# Patient Record
Sex: Male | Born: 1935 | Race: White | Hispanic: No | Smoking: Former smoker
Health system: Southern US, Community
[De-identification: ages and names within clinical notes are randomized; demographics above are authoritative.]

## PROBLEM LIST (undated history)

## (undated) ENCOUNTER — Emergency Department (HOSPITAL_COMMUNITY): Admission: EM | Discharge status: Absent without leave | Payer: Medicare Other

## (undated) DIAGNOSIS — Z8619 Personal history of other infectious and parasitic diseases: Secondary | ICD-10-CM

## (undated) DIAGNOSIS — H9190 Unspecified hearing loss, unspecified ear: Secondary | ICD-10-CM

## (undated) DIAGNOSIS — J329 Chronic sinusitis, unspecified: Secondary | ICD-10-CM

## (undated) DIAGNOSIS — I639 Cerebral infarction, unspecified: Secondary | ICD-10-CM

## (undated) DIAGNOSIS — N400 Enlarged prostate without lower urinary tract symptoms: Secondary | ICD-10-CM

## (undated) DIAGNOSIS — K449 Diaphragmatic hernia without obstruction or gangrene: Secondary | ICD-10-CM

---

## 2011-04-17 HISTORY — PX: PROSTATE SURGERY: SHX751

## 2014-04-23 ENCOUNTER — Encounter (HOSPITAL_COMMUNITY): Payer: Self-pay | Admitting: Internal Medicine

## 2014-04-23 ENCOUNTER — Inpatient Hospital Stay (HOSPITAL_COMMUNITY)
Admission: AD | Admit: 2014-04-23 | Discharge: 2014-04-26 | DRG: 480 | Disposition: A | Discharge status: Skilled Nursing Facility | Payer: Medicare Other | Source: Other Acute Inpatient Hospital | Attending: Family Medicine | Admitting: Family Medicine

## 2014-04-23 DIAGNOSIS — G9340 Encephalopathy, unspecified: Secondary | ICD-10-CM | POA: Diagnosis present

## 2014-04-23 DIAGNOSIS — Z609 Problem related to social environment, unspecified: Secondary | ICD-10-CM | POA: Diagnosis present

## 2014-04-23 DIAGNOSIS — R05 Cough: Secondary | ICD-10-CM | POA: Diagnosis not present

## 2014-04-23 DIAGNOSIS — Z823 Family history of stroke: Secondary | ICD-10-CM | POA: Diagnosis not present

## 2014-04-23 DIAGNOSIS — F22 Delusional disorders: Secondary | ICD-10-CM | POA: Diagnosis not present

## 2014-04-23 DIAGNOSIS — S42001A Fracture of unspecified part of right clavicle, initial encounter for closed fracture: Secondary | ICD-10-CM | POA: Diagnosis present

## 2014-04-23 DIAGNOSIS — S7291XA Unspecified fracture of right femur, initial encounter for closed fracture: Secondary | ICD-10-CM | POA: Diagnosis present

## 2014-04-23 DIAGNOSIS — G629 Polyneuropathy, unspecified: Secondary | ICD-10-CM | POA: Diagnosis present

## 2014-04-23 DIAGNOSIS — F411 Generalized anxiety disorder: Secondary | ICD-10-CM | POA: Diagnosis not present

## 2014-04-23 DIAGNOSIS — Z8619 Personal history of other infectious and parasitic diseases: Secondary | ICD-10-CM

## 2014-04-23 DIAGNOSIS — T40605A Adverse effect of unspecified narcotics, initial encounter: Secondary | ICD-10-CM | POA: Diagnosis not present

## 2014-04-23 DIAGNOSIS — N400 Enlarged prostate without lower urinary tract symptoms: Secondary | ICD-10-CM | POA: Diagnosis present

## 2014-04-23 DIAGNOSIS — Z66 Do not resuscitate: Secondary | ICD-10-CM | POA: Diagnosis present

## 2014-04-23 DIAGNOSIS — Z87891 Personal history of nicotine dependence: Secondary | ICD-10-CM

## 2014-04-23 DIAGNOSIS — F0391 Unspecified dementia with behavioral disturbance: Secondary | ICD-10-CM | POA: Diagnosis present

## 2014-04-23 DIAGNOSIS — F259 Schizoaffective disorder, unspecified: Secondary | ICD-10-CM | POA: Diagnosis not present

## 2014-04-23 DIAGNOSIS — H9190 Unspecified hearing loss, unspecified ear: Secondary | ICD-10-CM | POA: Diagnosis present

## 2014-04-23 DIAGNOSIS — Y92019 Unspecified place in single-family (private) house as the place of occurrence of the external cause: Secondary | ICD-10-CM | POA: Diagnosis not present

## 2014-04-23 DIAGNOSIS — K5909 Other constipation: Secondary | ICD-10-CM | POA: Diagnosis not present

## 2014-04-23 DIAGNOSIS — S72141A Displaced intertrochanteric fracture of right femur, initial encounter for closed fracture: Secondary | ICD-10-CM | POA: Diagnosis present

## 2014-04-23 DIAGNOSIS — Z8673 Personal history of transient ischemic attack (TIA), and cerebral infarction without residual deficits: Secondary | ICD-10-CM | POA: Diagnosis not present

## 2014-04-23 DIAGNOSIS — M25551 Pain in right hip: Secondary | ICD-10-CM | POA: Diagnosis present

## 2014-04-23 DIAGNOSIS — I4510 Unspecified right bundle-branch block: Secondary | ICD-10-CM | POA: Diagnosis present

## 2014-04-23 DIAGNOSIS — Z79899 Other long term (current) drug therapy: Secondary | ICD-10-CM | POA: Diagnosis not present

## 2014-04-23 DIAGNOSIS — Z7982 Long term (current) use of aspirin: Secondary | ICD-10-CM | POA: Diagnosis not present

## 2014-04-23 DIAGNOSIS — W1809XA Striking against other object with subsequent fall, initial encounter: Secondary | ICD-10-CM | POA: Diagnosis not present

## 2014-04-23 DIAGNOSIS — R1310 Dysphagia, unspecified: Secondary | ICD-10-CM | POA: Diagnosis not present

## 2014-04-23 DIAGNOSIS — S42009A Fracture of unspecified part of unspecified clavicle, initial encounter for closed fracture: Secondary | ICD-10-CM | POA: Diagnosis present

## 2014-04-23 DIAGNOSIS — F03918 Unspecified dementia, unspecified severity, with other behavioral disturbance: Secondary | ICD-10-CM | POA: Diagnosis present

## 2014-04-23 DIAGNOSIS — R9431 Abnormal electrocardiogram [ECG] [EKG]: Secondary | ICD-10-CM | POA: Diagnosis present

## 2014-04-23 DIAGNOSIS — D62 Acute posthemorrhagic anemia: Secondary | ICD-10-CM | POA: Diagnosis not present

## 2014-04-23 HISTORY — DX: Diaphragmatic hernia without obstruction or gangrene: K44.9

## 2014-04-23 HISTORY — DX: Chronic sinusitis, unspecified: J32.9

## 2014-04-23 HISTORY — DX: Benign prostatic hyperplasia without lower urinary tract symptoms: N40.0

## 2014-04-23 HISTORY — DX: Unspecified hearing loss, unspecified ear: H91.90

## 2014-04-23 HISTORY — DX: Personal history of other infectious and parasitic diseases: Z86.19

## 2014-04-23 HISTORY — DX: Cerebral infarction, unspecified: I63.9

## 2014-04-23 LAB — COMPREHENSIVE METABOLIC PANEL
ALK PHOS: 47 U/L (ref 39–117)
ALT: 21 U/L (ref 0–53)
AST: 25 U/L (ref 0–37)
Albumin: 4.2 g/dL (ref 3.5–5.2)
Anion gap: 3 — ABNORMAL LOW (ref 5–15)
BILIRUBIN TOTAL: 1.2 mg/dL (ref 0.3–1.2)
BUN: 12 mg/dL (ref 6–23)
CALCIUM: 8.4 mg/dL (ref 8.4–10.5)
CHLORIDE: 103 meq/L (ref 96–112)
CO2: 27 mmol/L (ref 19–32)
Creatinine, Ser: 0.88 mg/dL (ref 0.50–1.35)
GFR, EST AFRICAN AMERICAN: 88 mL/min — AB (ref >90)
GFR, EST NON AFRICAN AMERICAN: 76 mL/min — AB (ref >90)
GLUCOSE: 106 mg/dL — AB (ref 70–99)
Potassium: 3.3 mmol/L — ABNORMAL LOW (ref 3.5–5.1)
SODIUM: 133 mmol/L — AB (ref 135–145)
Total Protein: 6.6 g/dL (ref 6.0–8.3)

## 2014-04-23 LAB — CBC
HEMATOCRIT: 37.4 % — AB (ref 39.0–52.0)
Hemoglobin: 12.2 g/dL — ABNORMAL LOW (ref 13.0–17.0)
MCH: 30.2 pg (ref 26.0–34.0)
MCHC: 32.6 g/dL (ref 30.0–36.0)
MCV: 92.6 fL (ref 78.0–100.0)
Platelets: 183 10*3/uL (ref 150–400)
RBC: 4.04 MIL/uL — ABNORMAL LOW (ref 4.22–5.81)
RDW: 13.6 % (ref 11.5–15.5)
WBC: 7.5 10*3/uL (ref 4.0–10.5)

## 2014-04-23 LAB — TYPE AND SCREEN
ABO/RH(D): O NEG
ANTIBODY SCREEN: NEGATIVE

## 2014-04-23 LAB — PHOSPHORUS: Phosphorus: 3.1 mg/dL (ref 2.3–4.6)

## 2014-04-23 LAB — MAGNESIUM: Magnesium: 2 mg/dL (ref 1.5–2.5)

## 2014-04-23 LAB — CK: CK TOTAL: 1022 U/L — AB (ref 7–232)

## 2014-04-23 LAB — TROPONIN I: Troponin I: 0.03 ng/mL (ref <0.031)

## 2014-04-23 MED ORDER — LORAZEPAM 2 MG/ML IJ SOLN: 0.5000 mg | Freq: Four times a day (QID) | INTRAMUSCULAR | Status: DC | PRN

## 2014-04-23 MED ORDER — LORAZEPAM 0.5 MG PO TABS
0.5000 mg | ORAL_TABLET | Freq: Every day | ORAL | Status: DC
  Administered 2014-04-23 – 2014-04-25 (×3): 0.5 mg via ORAL
  Filled 2014-04-23 (×3): qty 1

## 2014-04-23 MED ORDER — SODIUM CHLORIDE 0.9 % IV SOLN
INTRAVENOUS | Status: AC
  Administered 2014-04-23: via INTRAVENOUS

## 2014-04-23 MED ORDER — TAMSULOSIN HCL 0.4 MG PO CAPS
0.4000 mg | ORAL_CAPSULE | Freq: Every day | ORAL | Status: DC
  Administered 2014-04-23 – 2014-04-25 (×3): 0.4 mg via ORAL
  Filled 2014-04-23 (×3): qty 1

## 2014-04-23 MED ORDER — MORPHINE SULFATE 2 MG/ML IJ SOLN
0.5000 mg | INTRAMUSCULAR | Status: DC | PRN
Start: 2014-04-23 — End: 2014-04-24

## 2014-04-23 MED ORDER — METHOCARBAMOL 1000 MG/10ML IJ SOLN
500.0000 mg | Freq: Four times a day (QID) | INTRAVENOUS | Status: DC | PRN
  Filled 2014-04-23: qty 5

## 2014-04-23 MED ORDER — CEFAZOLIN SODIUM-DEXTROSE 2-3 GM-% IV SOLR
2.0000 g | Freq: Once | INTRAVENOUS | Status: AC
  Administered 2014-04-24: 2 g via INTRAVENOUS

## 2014-04-23 MED ORDER — METHOCARBAMOL 500 MG PO TABS
500.0000 mg | ORAL_TABLET | Freq: Four times a day (QID) | ORAL | Status: DC | PRN
  Administered 2014-04-23: 500 mg via ORAL
  Filled 2014-04-23: qty 1

## 2014-04-23 MED ORDER — METOPROLOL TARTRATE 25 MG PO TABS
12.5000 mg | ORAL_TABLET | Freq: Two times a day (BID) | ORAL | Status: DC
  Administered 2014-04-23 – 2014-04-24 (×2): 12.5 mg via ORAL
  Filled 2014-04-23 (×3): qty 1

## 2014-04-23 MED ORDER — GABAPENTIN 300 MG PO CAPS
300.0000 mg | ORAL_CAPSULE | Freq: Two times a day (BID) | ORAL | Status: DC
  Administered 2014-04-23 – 2014-04-26 (×6): 300 mg via ORAL
  Filled 2014-04-23 (×8): qty 1

## 2014-04-23 MED ORDER — HYDROCODONE-ACETAMINOPHEN 5-325 MG PO TABS
1.0000 | ORAL_TABLET | Freq: Four times a day (QID) | ORAL | Status: DC | PRN
  Administered 2014-04-23: 2 via ORAL
  Filled 2014-04-23: qty 2

## 2014-04-23 MED ORDER — DOCUSATE SODIUM 100 MG PO CAPS
100.0000 mg | ORAL_CAPSULE | Freq: Two times a day (BID) | ORAL | Status: DC
  Administered 2014-04-23 – 2014-04-26 (×6): 100 mg via ORAL
  Filled 2014-04-23 (×7): qty 1

## 2014-04-23 MED ORDER — INFLUENZA VAC SPLIT QUAD 0.5 ML IM SUSY
0.5000 mL | PREFILLED_SYRINGE | INTRAMUSCULAR | Status: AC
  Administered 2014-04-26: 0.5 mL via INTRAMUSCULAR
  Filled 2014-04-23 (×3): qty 0.5

## 2014-04-23 MED ORDER — CHLORHEXIDINE GLUCONATE 4 % EX LIQD
60.0000 mL | Freq: Once | CUTANEOUS | Status: AC
  Administered 2014-04-24: 4 via TOPICAL
  Filled 2014-04-23: qty 60

## 2014-04-23 NOTE — Anesthesia Preprocedure Evaluation (Addendum)
Anesthesia Evaluation  Patient identified by MRN, date of birth, ID band Patient awake    Reviewed: Allergy & Precautions, NPO status , Patient's Chart, lab work & pertinent test results  History of Anesthesia Complications Negative for: history of anesthetic complications  Airway Mallampati: II  TM Distance: >3 FB Neck ROM: Full    Dental  (+) Edentulous Lower, Poor Dentition   Pulmonary former smoker,    Pulmonary exam normal       Cardiovascular negative cardio ROS  Rhythm:Regular     Neuro/Psych Dementia CVA    GI/Hepatic Neg liver ROS, hiatal hernia,   Endo/Other  negative endocrine ROS  Renal/GU negative Renal ROS     Musculoskeletal   Abdominal   Peds  Hematology   Anesthesia Other Findings   Reproductive/Obstetrics                           Anesthesia Physical Anesthesia Plan  ASA: III  Anesthesia Plan: MAC and Spinal   Post-op Pain Management:    Induction:   Airway Management Planned: Simple Face Mask  Additional Equipment:   Intra-op Plan:   Post-operative Plan:   Informed Consent: I have reviewed the patients History and Physical, chart, labs and discussed the procedure including the risks, benefits and alternatives for the proposed anesthesia with the patient or authorized representative who has indicated his/her understanding and acceptance.   Consent reviewed with POA  Plan Discussed with: CRNA, Anesthesiologist and Surgeon  Anesthesia Plan Comments:       Anesthesia Quick Evaluation

## 2014-04-23 NOTE — H&P (Addendum)
PCP: HASANAJ,XAJE A, MD    Chief Complaint:  fall  HPI: Scott Lin. is a 81 y.o. male   has a past medical history of Stroke; Sinusitis; History of shingles; BPH (benign prostatic hyperplasia); Hiatal hernia; and Hard of hearing.   Presented with  Sometime during the night patient tripped on his shoes and fell he lives alone. Patient was found within few hours.  He had pain on right side.  Taken to [facility] where plain films showed Right clavicular fracture and right proximal femur fracture. CT head showed no ICH but old CVA. He was transferred to [facility] for orthopedic intervention. Dr. Jillyn Hidden is aware of the patient and plans to operate in AM. Patient has no known cardiac hx. No complaints of chest pain. He never had any cardiac testing. Patient was able to walk with out chst pain or shortness of breath for about a tenth of a mile.  Family reports he has chronic cough. CXR at [facility] was unremarkable.  On arrival patient had ECG done that showed transient wide complex rhythm patient was asymptomatic at that time. Per [facility] records K wnl. Patient per family patient has been confused and have had paranoid ideas about his family. Family states he is not allowed to have phone in his house as he will constantly call  911.   Hospitalist was called for admission for [facility] fracture  Review of Systems:    Pertinent positives include: anxiety, non-productive cough,  Constitutional:  No weight loss, night sweats, Fevers, chills, fatigue, weight loss  HEENT:  No headaches, Difficulty swallowing,Tooth/dental problems,Sore throat,  No sneezing, itching, ear ache, nasal congestion, post nasal drip,  Cardio-vascular:  No chest pain, Orthopnea, PND, anasarca, dizziness, palpitations.no Bilateral lower extremity swelling  GI:  No heartburn, indigestion, abdominal pain, nausea, vomiting, diarrhea, change in bowel habits, loss of appetite, melena, blood in stool, hematemesis Resp:  no shortness  of breath at rest. No dyspnea on exertion, No excess mucus, no productive cough, No  No coughing up of blood.No change in color of mucus.No wheezing. Skin:  no rash or lesions. No jaundice GU:  no dysuria, change in color of urine, no urgency or frequency. No straining to urinate.  No flank pain.  Musculoskeletal:  No joint pain or no joint swelling. No decreased range of motion. No back pain.  Psych:  No change in mood or affect. No depression or anxiety. No memory loss.  Neuro: no localizing neurological complaints, no tingling, no weakness, no double vision, no gait abnormality, no slurred speech, no confusion  Otherwise ROS are negative except for above, 10 systems were reviewed  Past Medical History: Past Medical History  Diagnosis Date  . Stroke   . Sinusitis   . History of shingles   . BPH (benign prostatic hyperplasia)   . Hiatal hernia   . Hard of hearing    No past surgical history on file.   Medications: Prior to Admission medications   Medication Sig Start Date End Date Taking? Authorizing Provider  aspirin 81 MG tablet Take 81 mg by mouth daily.   Yes Historical Provider, MD    Allergies:  No Known Allergies  Social History:  Ambulatory   independently  Lives at home alone,           Family History: family history is not on file.    Physical Exam: Patient Vitals for the past 24 hrs:  BP Temp Pulse Resp SpO2  04/23/14 1825 (!) 129/46 mmHg 98.1  F (36.7 C) 92 18 98 %    1. General:  in No Acute distress, hard of hearing 2. Psychological: Alert but not Oriented 3. Head/ENT:   Dry Mucous Membranes                          Head Non traumatic, neck supple                          Poor Dentition 4. SKIN: decreased Skin turgor,  Skin clean Dry and intact no rash 5. Heart: Regular rate and rhythm mild Murmur noted, Rub or gallop 6. Lungs: Clear to auscultation bilaterally, no wheezes or crackles   7. Abdomen: Soft, non-tender, Non distended 8. Lower  extremities: no clubbing, cyanosis, or edema, Right Leg externally rotated 9. Neurologically Grossly intact, moving all 4 extremities equally 10. MSK: diminished range of motion of right leg  body mass index is unknown because there is no height or weight on file.   Labs on Admission:   No results found for this or any previous visit (from the past 24 hour(s)).  UA will order  No results found for: HGBA1C  CrCl cannot be calculated (Unknown ideal weight.).  BNP (last 3 results) No results for input(s): PROBNP in the last 8760 hours.  Other results:  I have pearsonaly reviewed this: ECG REPORT  Rate:80 Rhythm:  SR Left anterior fascicular block RBBB  ST&T Change: none Repeat ECG shoed wide QRS rhythm at rate 114   There were no vitals filed for this visit.   Cultures: No results found for: SDES, SPECREQUEST, CULT, REPTSTATUS   Radiological Exams on Admission: No results found.  Chart has been reviewed  Assessment/Plan  81 year old gentleman with history of dementia with paranoid ideas presents after mechanical fall with right femur fracture and right clavicle fracture. Routine EKG noted to have transient wide QRS complex arrhythmia While patient is asymptomatic  Present on Admission:   . Femur fracture, right - as per orthopedics, given abnormal ECG patient is moderate risk from cardiac standpoint. Will monitor on telemetry. Given worsening confusion cycle cardiac enzymes patient will benefit from echo gram. Patient's family at this point leaning he can stay any aggressive interventions except for fixing acute fractures. Family states they do understand that although patient may have increased risk from orthopedic procedure the benefits would outweigh the risks. We will start on perioperative beta blocker. Obtain electrolytes . Clavicle fracture as per orthopedics . Dementia with behavioral disturbance - patient has been showing worsening dementia with paranoid  delusions. Per family his currently more confused than his baseline. He would benefit from psychiatric evaluation, spoke with family and that it is not safe for patient to remain in current social situation due to severity of his confusion and inability to call for help  Encephalopathy. Patient appears to be confused . CT scan of the outside facility was unremarkable for head bleed. Will obtain UA. Give gentle fluids. Sundowning is a possibility  . BPH (benign prostatic hyperplasia) - continue Flomax  . Generalized anxiety disorder - patient takes Ativan at home. Family is unsure what the doses. Will start on Ativan 0.5 by mouth daily at bedtime the family will obtain the proper dosage for this in the morning  . Nonspecific abnormal electrocardiogram (ECG) (EKG) - given abnormal EKG will cycle cardiac enzymes patient would benefit from echo gram to evaluate EF or any wall motion abnormality  Prophylaxis: SCD  CODE STATUS:  DNR/DNI as per family  Other plan as per orders.  I have spent a total of 55 min on this admission  Scott Lin 04/23/2014, 8:03 PM  [facility]  Pager [phone]   after 2 AM please page floor coverage PA If 7AM-7PM, please contact the day team taking care of the patient  [email or web address]  Password [email or web address]

## 2014-04-23 NOTE — Consult Note (Signed)
Reason for Consult:Right hip  Fracture   Referring Physician: EDP   Scott Rainbowhomas Willig Jr. is an 81 y.o. male.  HPI: Larey SeatFell today. Demented. Seen in ER.   Past Medical History  Diagnosis Date  . Stroke   . Sinusitis   . History of shingles   . BPH (benign prostatic hyperplasia)   . Hiatal hernia   . Hard of hearing     Past Surgical History  Procedure Laterality Date  . Prostate surgery  2013    Family History  Problem Relation Age of Onset  . Stroke Father   . Dementia Sister     Social History:  reports that he has quit smoking. His smoking use included Cigars. He does not have any smokeless tobacco history on file. He reports that he does not drink alcohol or use illicit drugs.  Allergies: No Known Allergies  Medications:Meds reviewed    No results found for this or any previous visit (from the past 48 hour(s)).  No results found.  Review of Systems  Musculoskeletal: Positive for joint pain.  Psychiatric/Behavioral: The patient is nervous/anxious.   All other systems reviewed and are negative.  Blood pressure 129/46, pulse 92, temperature 98.1 F (36.7 C), temperature source Oral, resp. rate 18, height 6' (1.829 m), weight 64.864 kg (143 lb), SpO2 98 %. Physical Exam  Constitutional: He is oriented to person, place, and time. He appears well-developed.  HENT:  Head: Normocephalic.  Eyes: Pupils are equal, round, and reactive to light.  Neck: Normal range of motion.  Cardiovascular: Normal rate and regular rhythm.   Respiratory: Effort normal and breath sounds normal.  GI: Soft. Bowel sounds are normal.  Musculoskeletal: He exhibits tenderness.  Right hip short and rotated  Pain with motion of hip . NVI No DVT. Pain with palpation of clavicle  Neurological: He is alert and oriented to person, place, and time.  Skin: Skin is warm and dry.  Psychiatric: He has a normal mood and affect.    Assessment/Plan: Right hip intertrochanteric hip fracture. Clavicle  fracture. Plan IM nail hip when cleared. Risks discussed with family. Patient confused.   Adjoa Althouse C 04/23/2014, 9:49 PM  [phone]

## 2014-04-24 ENCOUNTER — Inpatient Hospital Stay (HOSPITAL_COMMUNITY): Payer: Medicare Other | Admitting: Anesthesiology

## 2014-04-24 ENCOUNTER — Encounter (HOSPITAL_COMMUNITY)
Admission: AD | Disposition: A | Discharge status: Skilled Nursing Facility | Payer: Self-pay | Source: Other Acute Inpatient Hospital | Attending: Family Medicine

## 2014-04-24 ENCOUNTER — Inpatient Hospital Stay (HOSPITAL_COMMUNITY): Payer: Medicare Other

## 2014-04-24 DIAGNOSIS — F259 Schizoaffective disorder, unspecified: Secondary | ICD-10-CM

## 2014-04-24 HISTORY — PX: INTRAMEDULLARY (IM) NAIL INTERTROCHANTERIC: SHX5875

## 2014-04-24 LAB — BASIC METABOLIC PANEL
Anion gap: 9 (ref 5–15)
BUN: 14 mg/dL (ref 6–23)
CALCIUM: 8.7 mg/dL (ref 8.4–10.5)
CO2: 27 mmol/L (ref 19–32)
Chloride: 101 mEq/L (ref 96–112)
Creatinine, Ser: 0.99 mg/dL (ref 0.50–1.35)
GFR calc Af Amer: 84 mL/min — ABNORMAL LOW (ref >90)
GFR, EST NON AFRICAN AMERICAN: 73 mL/min — AB (ref >90)
Glucose, Bld: 106 mg/dL — ABNORMAL HIGH (ref 70–99)
Potassium: 3.7 mmol/L (ref 3.5–5.1)
Sodium: 137 mmol/L (ref 135–145)

## 2014-04-24 LAB — CBC
HCT: 36.5 % — ABNORMAL LOW (ref 39.0–52.0)
Hemoglobin: 11.7 g/dL — ABNORMAL LOW (ref 13.0–17.0)
MCH: 30.1 pg (ref 26.0–34.0)
MCHC: 32.1 g/dL (ref 30.0–36.0)
MCV: 93.8 fL (ref 78.0–100.0)
Platelets: 188 10*3/uL (ref 150–400)
RBC: 3.89 MIL/uL — ABNORMAL LOW (ref 4.22–5.81)
RDW: 13.7 % (ref 11.5–15.5)
WBC: 8.1 10*3/uL (ref 4.0–10.5)

## 2014-04-24 LAB — TROPONIN I
TROPONIN I: 0.03 ng/mL (ref <0.031)
Troponin I: 0.03 ng/mL (ref <0.031)

## 2014-04-24 LAB — TSH: TSH: 1.912 u[IU]/mL (ref 0.350–4.500)

## 2014-04-24 LAB — ABO/RH: ABO/RH(D): O NEG

## 2014-04-24 LAB — VITAMIN B12: Vitamin B-12: 815 pg/mL (ref 211–911)

## 2014-04-24 SURGERY — FIXATION, FRACTURE, INTERTROCHANTERIC, WITH INTRAMEDULLARY ROD
Anesthesia: Monitor Anesthesia Care | Site: Hip | Laterality: Right

## 2014-04-24 MED ORDER — PHENOL 1.4 % MT LIQD: 1.0000 | OROMUCOSAL | Status: DC | PRN

## 2014-04-24 MED ORDER — CETYLPYRIDINIUM CHLORIDE 0.05 % MT LIQD
7.0000 mL | Freq: Two times a day (BID) | OROMUCOSAL | Status: DC
  Administered 2014-04-25 (×2): 7 mL via OROMUCOSAL

## 2014-04-24 MED ORDER — CHLORHEXIDINE GLUCONATE 0.12 % MT SOLN: 15.0000 mL | Freq: Two times a day (BID) | OROMUCOSAL | Status: DC

## 2014-04-24 MED ORDER — RESOURCE THICKENUP CLEAR PO POWD
ORAL | Status: DC | PRN
  Filled 2014-04-24: qty 125

## 2014-04-24 MED ORDER — PHENYLEPHRINE HCL 10 MG/ML IJ SOLN
10.0000 mg | INTRAVENOUS | Status: DC | PRN
  Administered 2014-04-24: 15 ug/min via INTRAVENOUS

## 2014-04-24 MED ORDER — ASPIRIN EC 325 MG PO TBEC
325.0000 mg | DELAYED_RELEASE_TABLET | Freq: Every day | ORAL | Status: DC
  Administered 2014-04-25 – 2014-04-26 (×2): 325 mg via ORAL
  Filled 2014-04-24 (×3): qty 1

## 2014-04-24 MED ORDER — HYDROMORPHONE HCL 1 MG/ML IJ SOLN
INTRAMUSCULAR | Status: AC
  Filled 2014-04-24: qty 1

## 2014-04-24 MED ORDER — MENTHOL 3 MG MT LOZG: 1.0000 | LOZENGE | OROMUCOSAL | Status: DC | PRN

## 2014-04-24 MED ORDER — ACETAMINOPHEN 650 MG RE SUPP: 650.0000 mg | Freq: Four times a day (QID) | RECTAL | Status: DC | PRN

## 2014-04-24 MED ORDER — LORAZEPAM 0.5 MG PO TABS
0.5000 mg | ORAL_TABLET | Freq: Every day | ORAL | Status: DC
  Administered 2014-04-25 – 2014-04-26 (×2): 0.5 mg via ORAL
  Filled 2014-04-24 (×2): qty 1

## 2014-04-24 MED ORDER — POTASSIUM CHLORIDE IN NACL 20-0.9 MEQ/L-% IV SOLN
INTRAVENOUS | Status: AC
  Filled 2014-04-24: qty 1000

## 2014-04-24 MED ORDER — CEFAZOLIN SODIUM-DEXTROSE 2-3 GM-% IV SOLR
INTRAVENOUS | Status: AC
  Filled 2014-04-24: qty 50

## 2014-04-24 MED ORDER — HYDROCODONE-ACETAMINOPHEN 5-325 MG PO TABS: 1.0000 | ORAL_TABLET | Freq: Four times a day (QID) | ORAL | Status: DC | PRN

## 2014-04-24 MED ORDER — ONDANSETRON HCL 4 MG/2ML IJ SOLN
INTRAMUSCULAR | Status: AC
  Filled 2014-04-24: qty 2

## 2014-04-24 MED ORDER — LACTATED RINGERS IV SOLN
INTRAVENOUS | Status: DC | PRN
  Administered 2014-04-24: 08:00:00 via INTRAVENOUS

## 2014-04-24 MED ORDER — MORPHINE SULFATE 2 MG/ML IJ SOLN: 0.5000 mg | INTRAMUSCULAR | Status: DC | PRN

## 2014-04-24 MED ORDER — 0.9 % SODIUM CHLORIDE (POUR BTL) OPTIME
TOPICAL | Status: DC | PRN
  Administered 2014-04-24: 1000 mL

## 2014-04-24 MED ORDER — FENTANYL CITRATE 0.05 MG/ML IJ SOLN
INTRAMUSCULAR | Status: DC | PRN
  Administered 2014-04-24 (×2): 25 ug via INTRAVENOUS

## 2014-04-24 MED ORDER — PROMETHAZINE HCL 25 MG/ML IJ SOLN: 6.2500 mg | INTRAMUSCULAR | Status: DC | PRN

## 2014-04-24 MED ORDER — PROPOFOL 10 MG/ML IV BOLUS
INTRAVENOUS | Status: AC
  Filled 2014-04-24: qty 20

## 2014-04-24 MED ORDER — FENTANYL CITRATE 0.05 MG/ML IJ SOLN
INTRAMUSCULAR | Status: AC
  Filled 2014-04-24: qty 2

## 2014-04-24 MED ORDER — ASPIRIN EC 325 MG PO TBEC: 325.0000 mg | DELAYED_RELEASE_TABLET | Freq: Every day | ORAL | Status: AC

## 2014-04-24 MED ORDER — METOCLOPRAMIDE HCL 10 MG PO TABS: 5.0000 mg | ORAL_TABLET | Freq: Three times a day (TID) | ORAL | Status: DC | PRN

## 2014-04-24 MED ORDER — PHENYLEPHRINE HCL 10 MG/ML IJ SOLN
INTRAMUSCULAR | Status: DC | PRN
  Administered 2014-04-24: 80 ug via INTRAVENOUS
  Administered 2014-04-24: 120 ug via INTRAVENOUS
  Administered 2014-04-24 (×2): 80 ug via INTRAVENOUS

## 2014-04-24 MED ORDER — BUPIVACAINE IN DEXTROSE 0.75-8.25 % IT SOLN
INTRATHECAL | Status: DC | PRN
  Administered 2014-04-24: 12 mg via INTRATHECAL

## 2014-04-24 MED ORDER — PROPOFOL INFUSION 10 MG/ML OPTIME
INTRAVENOUS | Status: DC | PRN
  Administered 2014-04-24: 100 ug/kg/min via INTRAVENOUS

## 2014-04-24 MED ORDER — CISATRACURIUM BESYLATE 20 MG/10ML IV SOLN
INTRAVENOUS | Status: AC
  Filled 2014-04-24: qty 10

## 2014-04-24 MED ORDER — ONDANSETRON HCL 4 MG PO TABS: 4.0000 mg | ORAL_TABLET | Freq: Four times a day (QID) | ORAL | Status: DC | PRN

## 2014-04-24 MED ORDER — METOCLOPRAMIDE HCL 5 MG/ML IJ SOLN: 5.0000 mg | Freq: Three times a day (TID) | INTRAMUSCULAR | Status: DC | PRN

## 2014-04-24 MED ORDER — VITAMIN D3 25 MCG (1000 UNIT) PO TABS
1000.0000 [IU] | ORAL_TABLET | Freq: Every day | ORAL | Status: DC
  Administered 2014-04-24 – 2014-04-26 (×3): 1000 [IU] via ORAL
  Filled 2014-04-24 (×3): qty 1

## 2014-04-24 MED ORDER — HYDROMORPHONE HCL 1 MG/ML IJ SOLN
0.2500 mg | INTRAMUSCULAR | Status: DC | PRN
  Administered 2014-04-24 (×2): 0.5 mg via INTRAVENOUS

## 2014-04-24 MED ORDER — CHLORHEXIDINE GLUCONATE 0.12 % MT SOLN
15.0000 mL | Freq: Two times a day (BID) | OROMUCOSAL | Status: DC
  Administered 2014-04-24 – 2014-04-25 (×2): 15 mL via OROMUCOSAL
  Filled 2014-04-24 (×7): qty 15

## 2014-04-24 MED ORDER — OXYCODONE HCL 5 MG PO TABS: 5.0000 mg | ORAL_TABLET | Freq: Once | ORAL | Status: DC | PRN

## 2014-04-24 MED ORDER — DOCUSATE SODIUM 100 MG PO CAPS: 100.0000 mg | ORAL_CAPSULE | Freq: Two times a day (BID) | ORAL | Status: AC | PRN

## 2014-04-24 MED ORDER — HYDROCODONE-ACETAMINOPHEN 5-325 MG PO TABS
1.0000 | ORAL_TABLET | Freq: Four times a day (QID) | ORAL | Status: DC | PRN
  Administered 2014-04-25: 2 via ORAL
  Filled 2014-04-24: qty 2

## 2014-04-24 MED ORDER — OXYCODONE HCL 5 MG/5ML PO SOLN: 5.0000 mg | Freq: Once | ORAL | Status: DC | PRN

## 2014-04-24 MED ORDER — CEFAZOLIN SODIUM-DEXTROSE 2-3 GM-% IV SOLR
2.0000 g | Freq: Three times a day (TID) | INTRAVENOUS | Status: AC
  Administered 2014-04-24 – 2014-04-25 (×2): 2 g via INTRAVENOUS
  Filled 2014-04-24 (×2): qty 50

## 2014-04-24 MED ORDER — ACETAMINOPHEN 325 MG PO TABS: 650.0000 mg | ORAL_TABLET | Freq: Four times a day (QID) | ORAL | Status: DC | PRN

## 2014-04-24 MED ORDER — PROPOFOL INFUSION 10 MG/ML OPTIME: INTRAVENOUS | Status: DC | PRN

## 2014-04-24 MED ORDER — ONDANSETRON HCL 4 MG/2ML IJ SOLN: 4.0000 mg | Freq: Four times a day (QID) | INTRAMUSCULAR | Status: DC | PRN

## 2014-04-24 MED ORDER — POTASSIUM CHLORIDE IN NACL 20-0.9 MEQ/L-% IV SOLN
INTRAVENOUS | Status: AC
  Administered 2014-04-24: 10:00:00 via INTRAVENOUS
  Filled 2014-04-24 (×3): qty 1000

## 2014-04-24 MED ORDER — PROPOFOL 10 MG/ML IV BOLUS
INTRAVENOUS | Status: DC | PRN
  Administered 2014-04-24 (×2): 20 mg via INTRAVENOUS

## 2014-04-24 SURGICAL SUPPLY — 43 items
BAG ZIPLOCK 12X15 (MISCELLANEOUS) ×3 IMPLANT
BIT DRILL CANN LG 4.3MM (BIT) ×1 IMPLANT
BNDG COHESIVE 6X5 TAN STRL LF (GAUZE/BANDAGES/DRESSINGS) ×3 IMPLANT
BNDG GAUZE ELAST 4 BULKY (GAUZE/BANDAGES/DRESSINGS) ×3 IMPLANT
CLOTH 2% CHLOROHEXIDINE 3PK (PERSONAL CARE ITEMS) ×3 IMPLANT
COVER PERINEAL POST (MISCELLANEOUS) ×3 IMPLANT
DRAPE C-ARM 42X120 X-RAY (DRAPES) IMPLANT
DRAPE STERI IOBAN 125X83 (DRAPES) ×3 IMPLANT
DRILL BIT CANN LG 4.3MM (BIT) ×3
DRSG MEPILEX BORDER 4X4 (GAUZE/BANDAGES/DRESSINGS) ×3 IMPLANT
DRSG MEPILEX BORDER 4X8 (GAUZE/BANDAGES/DRESSINGS) ×3 IMPLANT
DRSG PAD ABDOMINAL 8X10 ST (GAUZE/BANDAGES/DRESSINGS) ×3 IMPLANT
DURAPREP 26ML APPLICATOR (WOUND CARE) ×3 IMPLANT
ELECT REM PT RETURN 9FT ADLT (ELECTROSURGICAL) ×3
ELECTRODE REM PT RTRN 9FT ADLT (ELECTROSURGICAL) ×1 IMPLANT
GAUZE SPONGE 4X4 12PLY STRL (GAUZE/BANDAGES/DRESSINGS) ×3 IMPLANT
GLOVE BIOGEL PI IND STRL 7.5 (GLOVE) ×1 IMPLANT
GLOVE BIOGEL PI IND STRL 8 (GLOVE) ×1 IMPLANT
GLOVE BIOGEL PI INDICATOR 7.5 (GLOVE) ×2
GLOVE BIOGEL PI INDICATOR 8 (GLOVE) ×2
GLOVE SURG SS PI 7.5 STRL IVOR (GLOVE) ×3 IMPLANT
GLOVE SURG SS PI 8.0 STRL IVOR (GLOVE) IMPLANT
GOWN STRL REUS W/ TWL XL LVL3 (GOWN DISPOSABLE) ×1 IMPLANT
GOWN STRL REUS W/TWL LRG LVL3 (GOWN DISPOSABLE) ×3 IMPLANT
GOWN STRL REUS W/TWL XL LVL3 (GOWN DISPOSABLE) ×8 IMPLANT
GUIDEPIN 3.2X17.5 THRD DISP (PIN) ×6 IMPLANT
HFN 125 DEG 13MM X 180MM (Nail) ×3 IMPLANT
HFN A/R SCREW 70MM (Screw) ×3 IMPLANT
HIP FRA NAIL LAG SCREW 10.5X90 (Orthopedic Implant) ×3 IMPLANT
KIT BASIN OR (CUSTOM PROCEDURE TRAY) ×3 IMPLANT
MANIFOLD NEPTUNE II (INSTRUMENTS) ×3 IMPLANT
PACK GENERAL/GYN (CUSTOM PROCEDURE TRAY) ×3 IMPLANT
PAD CAST 4YDX4 CTTN HI CHSV (CAST SUPPLIES) ×1 IMPLANT
PADDING CAST COTTON 4X4 STRL (CAST SUPPLIES) ×2
SCREW BONE CORTICAL 5.0X38 (Screw) ×3 IMPLANT
SCREW DRILL BIT ANIT ROTATION (BIT) ×3 IMPLANT
SCREW LAG HIP FRA NAIL 10.5X90 (Orthopedic Implant) ×1 IMPLANT
STAPLER VISISTAT (STAPLE) ×3 IMPLANT
SUT VIC AB 0 CT1 27 (SUTURE) ×4
SUT VIC AB 0 CT1 27XBRD ANTBC (SUTURE) ×2 IMPLANT
SUT VIC AB 2-0 CT1 27 (SUTURE) ×2
SUT VIC AB 2-0 CT1 TAPERPNT 27 (SUTURE) ×1 IMPLANT
TRAY FOLEY CATH 14FRSI W/METER (CATHETERS) IMPLANT

## 2014-04-24 NOTE — Consult Note (Signed)
[facility] Face-to-Face Psychiatry Consult   Reason for Consult:  Psychosis and recent fall Referring Physician:  Dr. Hansel Feinstein Scott Lin. is an 81 y.o. male. Total Time spent with patient: 30 minutes  Assessment: AXIS I:  Schizoaffective Disorder AXIS II:  Deferred AXIS III:   Past Medical History  Diagnosis Date  . Stroke   . Sinusitis   . History of shingles   . BPH (benign prostatic hyperplasia)   . Hiatal hernia   . Hard of hearing    AXIS IV:  other psychosocial or environmental problems, problems related to social environment and problems with primary support group AXIS V:  41-50 serious symptoms  Plan:  Patient does not meet criteria for capacity to make his own medical decisions and living arrangements at this time based on my evaluation Continue current medication management Recommend psychiatric Inpatient admission when medically cleared. Supportive therapy provided about ongoing stressors.  Subjective:   Scott Lin. is a 81 y.o. male patient admitted with psychosis and recent fall.  HPI:  Scott Lin. is a 81 y.o. male seen and chart reviewed for psychiatric evaluation and consultation for psychosis. Reportedly patient has been suffering with chronic and recurrent psychosis, dementia with behavioral disturbance and recent fall resulted right femur fracture and clavicle fracture. Patient was unable to provide history, so most of the history is obtained from the available medical records.  Medical history: Scott Lin  has a past medical history of Stroke; Sinusitis; History of shingles; BPH (benign prostatic hyperplasia); Hiatal hernia; and Hard of hearing.   Presented with Sometime during the night patient tripped on his shoes and fell he lives alone. Patient was found within few hours. He had pain on right side.  Taken to [facility] where plain films showed Right clavicular fracture and right proximal femur fracture. CT head showed no ICH but old CVA. He was  transferred to [facility] for orthopedic intervention. Dr. Maxie Better is aware of the patient and plans to operate in AM. Patient has no known cardiac hx. No complaints of chest pain. He never had any cardiac testing. Patient was able to walk with out chst pain or shortness of breath for about a tenth of a mile.  Family reports he has chronic cough. CXR at [facility] was unremarkable.  On arrival patient had ECG done that showed transient wide complex rhythm patient was asymptomatic at that time. Per [facility] records K wnl. Patient per family patient has been confused and have had paranoid ideas about his family. Family states he is not allowed to have phone in his house as he will constantly call 911.    Past Psychiatric History: Past Medical History  Diagnosis Date  . Stroke   . Sinusitis   . History of shingles   . BPH (benign prostatic hyperplasia)   . Hiatal hernia   . Hard of hearing     reports that he has quit smoking. His smoking use included Cigars. He does not have any smokeless tobacco history on file. He reports that he does not drink alcohol or use illicit drugs. Family History  Problem Relation Age of Onset  . Stroke Father   . Dementia Sister          Abuse/Neglect [facility]) Physical Abuse: Denies Verbal Abuse: Denies Sexual Abuse: Denies Allergies:  No Known Allergies  ACT Assessment Complete:  No  Objective: Blood pressure 120/46, pulse 81, temperature 98.4 F (36.9 C), temperature source Oral, resp. rate 16, height 6' (1.829 m), weight 62.3  kg (137 lb 5.6 oz), SpO2 95 %.Body mass index is 18.62 kg/(m^2). Results for orders placed or performed during the hospital encounter of 04/23/14 (from the past 72 hour(s))  Type and screen     Status: None   Collection Time: 04/23/14 10:00 PM  Result Value Ref Range   ABO/RH(D) O NEG    Antibody Screen NEG    Sample Expiration 04/26/2014   CBC     Status: Abnormal   Collection Time: 04/23/14 10:00 PM  Result Value Ref Range   WBC 7.5  4.0 - 10.5 K/uL   RBC 4.04 (L) 4.22 - 5.81 MIL/uL   Hemoglobin 12.2 (L) 13.0 - 17.0 g/dL   HCT 13.6 (L) 57.8 - 42.9 %   MCV 92.6 78.0 - 100.0 fL   MCH 30.2 26.0 - 34.0 pg   MCHC 32.6 30.0 - 36.0 g/dL   RDW 27.7 26.2 - 42.0 %   Platelets 183 150 - 400 K/uL  Comprehensive metabolic panel     Status: Abnormal   Collection Time: 04/23/14 10:00 PM  Result Value Ref Range   Sodium 133 (L) 135 - 145 mmol/L    Comment: Please note change in reference range.   Potassium 3.3 (L) 3.5 - 5.1 mmol/L    Comment: Please note change in reference range.   Chloride 103 96 - 112 mEq/L   CO2 27 19 - 32 mmol/L   Glucose, Bld 106 (H) 70 - 99 mg/dL   BUN 12 6 - 23 mg/dL   Creatinine, Ser 9.11 0.50 - 1.35 mg/dL   Calcium 8.4 8.4 - 53.5 mg/dL   Total Protein 6.6 6.0 - 8.3 g/dL   Albumin 4.2 3.5 - 5.2 g/dL   AST 25 0 - 37 U/L   ALT 21 0 - 53 U/L   Alkaline Phosphatase 47 39 - 117 U/L   Total Bilirubin 1.2 0.3 - 1.2 mg/dL   GFR calc non Af Amer 76 (L) >90 mL/min   GFR calc Af Amer 88 (L) >90 mL/min    Comment: (NOTE) The eGFR has been calculated using the CKD EPI equation. This calculation has not been validated in all clinical situations. eGFR's persistently <90 mL/min signify possible Chronic Kidney Disease.    Anion gap 3 (L) 5 - 15  TSH     Status: None   Collection Time: 04/23/14 10:00 PM  Result Value Ref Range   TSH 1.912 0.350 - 4.500 uIU/mL    Comment: Performed at [facility]  CK     Status: Abnormal   Collection Time: 04/23/14 10:00 PM  Result Value Ref Range   Total CK 1022 (H) 7 - 232 U/L  Troponin I (q 6hr x 3)     Status: None   Collection Time: 04/23/14 10:00 PM  Result Value Ref Range   Troponin I 0.03 <0.031 ng/mL    Comment:        NO INDICATION OF MYOCARDIAL INJURY. Please note change in reference range.   Vitamin B12     Status: None   Collection Time: 04/23/14 10:00 PM  Result Value Ref Range   Vitamin B-12 815 211 - 911 pg/mL    Comment: Performed at  [employer]  Phosphorus     Status: None   Collection Time: 04/23/14 10:00 PM  Result Value Ref Range   Phosphorus 3.1 2.3 - 4.6 mg/dL  Magnesium     Status: None   Collection Time: 04/23/14 10:00 PM  Result  Value Ref Range   Magnesium 2.0 1.5 - 2.5 mg/dL  ABO/Rh     Status: None   Collection Time: 04/23/14 10:00 PM  Result Value Ref Range   ABO/RH(D) O NEG   CBC     Status: Abnormal   Collection Time: 04/24/14  3:30 AM  Result Value Ref Range   WBC 8.1 4.0 - 10.5 K/uL   RBC 3.89 (L) 4.22 - 5.81 MIL/uL   Hemoglobin 11.7 (L) 13.0 - 17.0 g/dL   HCT 36.5 (L) 39.0 - 52.0 %   MCV 93.8 78.0 - 100.0 fL   MCH 30.1 26.0 - 34.0 pg   MCHC 32.1 30.0 - 36.0 g/dL   RDW 13.7 11.5 - 15.5 %   Platelets 188 150 - 400 K/uL  Basic metabolic panel     Status: Abnormal   Collection Time: 04/24/14  3:30 AM  Result Value Ref Range   Sodium 137 135 - 145 mmol/L    Comment: Please note change in reference range.   Potassium 3.7 3.5 - 5.1 mmol/L    Comment: Please note change in reference range.   Chloride 101 96 - 112 mEq/L   CO2 27 19 - 32 mmol/L   Glucose, Bld 106 (H) 70 - 99 mg/dL   BUN 14 6 - 23 mg/dL   Creatinine, Ser 0.99 0.50 - 1.35 mg/dL   Calcium 8.7 8.4 - 10.5 mg/dL   GFR calc non Af Amer 73 (L) >90 mL/min   GFR calc Af Amer 84 (L) >90 mL/min    Comment: (NOTE) The eGFR has been calculated using the CKD EPI equation. This calculation has not been validated in all clinical situations. eGFR's persistently <90 mL/min signify possible Chronic Kidney Disease.    Anion gap 9 5 - 15  Troponin I (q 6hr x 3)     Status: None   Collection Time: 04/24/14  3:30 AM  Result Value Ref Range   Troponin I 0.03 <0.031 ng/mL    Comment:        NO INDICATION OF MYOCARDIAL INJURY. Please note change in reference range.    Labs are reviewed and are pertinent for increased total CK  Current Facility-Administered Medications  Medication Dose Route Frequency Provider Last Rate Last Dose   . 0.9 % NaCl with KCl 20 mEq/ L  infusion   Intravenous Continuous Johnn Hai, MD 75 mL/hr at 04/24/14 1007    . acetaminophen (TYLENOL) tablet 650 mg  650 mg Oral Q6H PRN Johnn Hai, MD       Or  . acetaminophen (TYLENOL) suppository 650 mg  650 mg Rectal Q6H PRN Johnn Hai, MD      . antiseptic oral rinse (CPC / CETYLPYRIDINIUM CHLORIDE 0.05%) solution 7 mL  7 mL Mouth Rinse q12n4p Toy Baker, MD   7 mL at 04/24/14 1200  . [START ON 04/25/2014] aspirin EC tablet 325 mg  325 mg Oral Q breakfast Johnn Hai, MD      . ceFAZolin (ANCEF) IVPB 2 g/50 mL premix  2 g Intravenous Q8H Johnn Hai, MD      . chlorhexidine (PERIDEX) 0.12 % solution 15 mL  15 mL Mouth Rinse BID Toy Baker, MD   15 mL at 04/24/14 0800  . cholecalciferol (VITAMIN D) tablet 1,000 Units  1,000 Units Oral Daily Johnn Hai, MD      . docusate sodium (COLACE) capsule 100 mg  100 mg Oral BID Toy Baker, MD  100 mg at 04/23/14 2338  . gabapentin (NEURONTIN) capsule 300 mg  300 mg Oral BID Toy Baker, MD   300 mg at 04/23/14 2359  . HYDROcodone-acetaminophen (NORCO/VICODIN) 5-325 MG per tablet 1-2 tablet  1-2 tablet Oral Q6H PRN Johnn Hai, MD      . Influenza vac split quadrivalent PF (FLUARIX) injection 0.5 mL  0.5 mL Intramuscular Tomorrow-1000 Toy Baker, MD      . LORazepam (ATIVAN) injection 0.5 mg  0.5 mg Intravenous Q6H PRN Toy Baker, MD      . LORazepam (ATIVAN) tablet 0.5 mg  0.5 mg Oral QHS Toy Baker, MD   0.5 mg at 04/23/14 2338  . [START ON 04/25/2014] LORazepam (ATIVAN) tablet 0.5 mg  0.5 mg Oral Q breakfast Johnn Hai, MD      . menthol-cetylpyridinium (CEPACOL) lozenge 3 mg  1 lozenge Oral PRN Johnn Hai, MD       Or  . phenol (CHLORASEPTIC) mouth spray 1 spray  1 spray Mouth/Throat PRN Johnn Hai, MD      . methocarbamol (ROBAXIN) tablet 500 mg  500 mg Oral Q6H PRN Toy Baker, MD   500 mg at 04/23/14  2338   Or  . methocarbamol (ROBAXIN) 500 mg in dextrose 5 % 50 mL IVPB  500 mg Intravenous Q6H PRN Toy Baker, MD      . metoCLOPramide (REGLAN) tablet 5-10 mg  5-10 mg Oral Q8H PRN Johnn Hai, MD       Or  . metoCLOPramide (REGLAN) injection 5-10 mg  5-10 mg Intravenous Q8H PRN Johnn Hai, MD      . metoprolol tartrate (LOPRESSOR) tablet 12.5 mg  12.5 mg Oral BID Toy Baker, MD   12.5 mg at 04/23/14 2338  . morphine 2 MG/ML injection 0.5 mg  0.5 mg Intravenous Q2H PRN Johnn Hai, MD      . ondansetron [facility]) tablet 4 mg  4 mg Oral Q6H PRN Johnn Hai, MD       Or  . ondansetron [facility]) injection 4 mg  4 mg Intravenous Q6H PRN Johnn Hai, MD      . tamsulosin [facility]) capsule 0.4 mg  0.4 mg Oral Daily Toy Baker, MD   0.4 mg at 04/23/14 2359    Psychiatric Specialty Exam: Physical Exam as per history and physical   ROS patient has been on sedation unable to contribute   Blood pressure 120/46, pulse 81, temperature 98.4 F (36.9 C), temperature source Oral, resp. rate 16, height 6' (1.829 m), weight 62.3 kg (137 lb 5.6 oz), SpO2 95 %.Body mass index is 18.62 kg/(m^2).  General Appearance: Disheveled  Eye Sport and exercise psychologist::  NA  Speech:  NA  Volume:  Decreased  Mood:  NA  Affect:  NA  Thought Process:  NA  Orientation:  NA  Thought Content:  NA  Suicidal Thoughts:  No  Homicidal Thoughts:  No  Memory:  NA  Judgement:  NA  Insight:  NA  Psychomotor Activity:  Decreased  Concentration:  NA  Recall:  NA  Fund of Knowledge:NA  Language: NA  Akathisia:  NA  Handed:  Right  AIMS (if indicated):     Assets:  Others:  Unable to contribute  Sleep:      Musculoskeletal: Strength & Muscle Tone: decreased Gait & Station: unable to stand Patient leans: N/A  Treatment Plan Summary: Daily contact with patient to assess and evaluate symptoms and progress in treatment Medication management  Elka Satterfield,JANARDHAHA R. 04/24/2014 11:23 AM

## 2014-04-24 NOTE — Progress Notes (Signed)
Clarified with Dr. Mahala MenghiniSamtani that patient needs a safety sitter at bedside at all times for 24 hours. Called family and they did not any intention of coming back this evening. Will Administratorsupplly safety sitter.

## 2014-04-24 NOTE — Anesthesia Procedure Notes (Signed)
Spinal Patient location during procedure: OR Start time: 04/24/2014 7:54 AM End time: 04/24/2014 8:04 AM Staffing Anesthesiologist: Heather RobertsSINGER, Skylah Delauter Performed by: anesthesiologist  Preanesthetic Checklist Completed: patient identified, surgical consent, pre-op evaluation, timeout performed, IV checked, risks and benefits discussed and monitors and equipment checked Spinal Block Patient position: sitting Prep: ChloraPrep Patient monitoring: cardiac monitor, continuous pulse ox and blood pressure Approach: midline Location: L3-4 Injection technique: single-shot Needle Needle type: Quincke  Needle gauge: 22 G Needle length: 9 cm Additional Notes Functioning IV was confirmed and monitors were applied. Sterile prep and drape, including hand hygiene and sterile gloves were used. The patient was positioned and the spine was prepped. The skin was anesthetized with lidocaine.  Free flow of clear CSF was obtained prior to injecting local anesthetic into the CSF.  The spinal needle aspirated freely following injection.  The needle was carefully withdrawn.  The patient tolerated the procedure well.

## 2014-04-24 NOTE — Evaluation (Signed)
SLP Cancellation Note  Patient Details Name: Scott Rainbowhomas Didion Jr. MRN: [id number] DOB: 09/29/1926   Cancelled treatment:       Reason Eval/Treat Not Completed:  (pt s/p surgery this am, will reattempt later today)   Donavan Burnetamara Desirae Mancusi, MS [facility] SLP [phone]

## 2014-04-24 NOTE — Anesthesia Postprocedure Evaluation (Signed)
Anesthesia Post Note  Patient: Scott Rainbowhomas Fiore Jr.  Procedure(s) Performed: Procedure(s) (LRB): INTRAMEDULLARY (IM) NAIL INTERTROCHANTRIC (Right)  Anesthesia type: MAC/SAB  Patient location: PACU  Post pain: Pain level controlled  Post assessment: Patient's Cardiovascular Status Stable  Last Vitals:  Filed Vitals:   04/24/14 0955  BP:   Pulse: 78  Temp:   Resp: 14    Post vital signs: Reviewed and stable  Level of consciousness: sedated  Complications: No apparent anesthesia complications

## 2014-04-24 NOTE — Progress Notes (Signed)
Received report on patient at 0720 and then transported patient and family to OR.

## 2014-04-24 NOTE — Progress Notes (Addendum)
Scott Lin. WGN:[id number] DOB: July 23, 1926 DOA: 04/23/2014 PCP: Toma Deiters, MD  Brief narrative: 81 y/o ? prior h/o CVA, BPH,HOH,shingles, possible dementia c behavioural issues/+-Bipolar admitted 04/23/14 c Fall + R clavicular #/R Proximal femoral #  Past medical history-As per Problem list Chart reviewed as below- reviewed  Consultants:  Psychiatry  Ortho  Procedures:  R Im nail 1/9  Antibiotics:  none   Subjective  Calm, sleeping Nursing reports no issues and hedoesn;t seem to needsitter   Objective    Interim History:   Telemetry:    Objective: Filed Vitals:   04/24/14 1005 04/24/14 1010 04/24/14 1015 04/24/14 1051  BP:   106/49 120/46  Pulse: 78 77 77 81  Temp:   98.5 F (36.9 C) 98.4 F (36.9 C)  TempSrc:      Resp: Height:      Weight:      SpO2: 98% 98% 97% 95%    Intake/Output Summary (Last 24 hours) at 04/24/14 1118 Last data filed at 04/24/14 1056  Gross per 24 hour  Intake   1595 ml  Output    610 ml  Net    985 ml    Exam:  General: eomi, sleepy Cardiovascular: s1 s 2no m/r/g Respiratory:  Clear  Data Reviewed: Basic Metabolic Panel:  Recent Labs Lab 04/23/14 2200 04/24/14 0330  NA 133* 137  K 3.3* 3.7  CL 103 101  CO2 27 27  GLUCOSE 106* 106*  BUN 12 14  CREATININE 0.88 0.99  CALCIUM 8.4 8.7  MG 2.0  --   PHOS 3.1  --    Liver Function Tests:  Recent Labs Lab 04/23/14 2200  AST 25  ALT 21  ALKPHOS 47  BILITOT 1.2  PROT 6.6  ALBUMIN 4.2   No results for input(s): LIPASE, AMYLASE in the last 168 hours. No results for input(s): AMMONIA in the last 168 hours. CBC:  Recent Labs Lab 04/23/14 2200 04/24/14 0330  WBC 7.5 8.1  HGB 12.2* 11.7*  HCT 37.4* 36.5*  MCV 92.6 93.8  PLT 183 188   Cardiac Enzymes:  Recent Labs Lab 04/23/14 2200 04/24/14 0330  CKTOTAL 1022*  --   TROPONINI 0.03 0.03   BNP: Invalid input(s): POCBNP CBG: No results for input(s): GLUCAP in the last  168 hours.  No results found for this or any previous visit (from the past 240 hour(s)).   Studies:              All Imaging reviewed and is as per above notation   Scheduled Meds: . antiseptic oral rinse  7 mL Mouth Rinse q12n4p  . [START ON 04/25/2014] aspirin EC  325 mg Oral Q breakfast  .  ceFAZolin (ANCEF) IV  2 g Intravenous Q8H  . chlorhexidine  15 mL Mouth Rinse BID  . cholecalciferol  1,000 Units Oral Daily  . docusate sodium  100 mg Oral BID  . gabapentin  300 mg Oral BID  . Influenza vac split quadrivalent PF  0.5 mL Intramuscular Tomorrow-1000  . LORazepam  0.5 mg Oral QHS  . [START ON 04/25/2014] LORazepam  0.5 mg Oral Q breakfast  . metoprolol tartrate  12.5 mg Oral BID  . tamsulosin  0.4 mg Oral Daily   Continuous Infusions: . 0.9 % NaCl with KCl 20 mEq / L 75 mL/hr at 04/24/14 1007     Assessment/Plan: 1. Fall c Hip #R s/p IM nail 1/9-stable-orth oto determine pain control, AC,  WBAT orders etc. Will need SNF-Peri-op B Blocker useful if started >2 weeks prior to surgery and as is a little hypotensive post surgery, will d/c.  AM cbc 2. Severe dementia + behavioural dist +/- anxiety-Psych MD input appreciated-Ativan considered Neurotoxic long term-on Celexa 20 usually 3. R clavicular #-Apllied sling-OP re-eval 4. ?dysphagia-Dys 3 diet, nectar thickened-nursing to assist c feeds 5.  prior CVA-start ASA 325 1/10 6. BPH-continue FLomax 0.4 qd 7. Neuropathy-continue Gabapentin bid  Code Status: presumed full Family Communication: none bedside Disposition Plan: inpatient   Pleas KochJai Camay Pedigo, MD  [facility] Pager [phone] 04/24/2014, 11:18 AM    LOS: 1 day

## 2014-04-24 NOTE — Evaluation (Signed)
Clinical/Bedside Swallow Evaluation Patient Details  Name: Scott Rainbowhomas Toon Jr. MRN: [id number] Date of Birth: 02/18/1927  Today's Date: 04/24/2014 Time: 1230-1310 SLP Time Calculation (min) (ACUTE ONLY): 40 min  Past Medical History:  Past Medical History  Diagnosis Date  . Stroke   . Sinusitis   . History of shingles   . BPH (benign prostatic hyperplasia)   . Hiatal hernia   . Hard of hearing    Past Surgical History:  Past Surgical History  Procedure Laterality Date  . Prostate surgery  2013   HPI:  81 y/o ? prior h/o CVA, BPH,HOH,shingles, possible dementia c behavioural issues/+-Bipolar admitted 04/23/14 c Fall + R clavicular,  R Proximal femoral  sp sx this am.  swallow eval ordered on admit.   Pt has not undergone chest imaging studies at this time.    Assessment / Plan / Recommendation Clinical Impression  Pt presents with negative CN exam except vocal hoarseness *that may be due to surgery this am.  He was not oriented to place or situation during evaluation and is very HOH and no family present to provide hx. Some information he provides appears accurate and RN confirms.    Pt does admit to premorbid occasional difficulties with swallowing resulting  stating he coughs sometimes with solids.  SLP aided pt with feeding, he was able to hold his own cup.  Overt coughing noted immediately post swallow of thin water via tsp - suspect premature spillage of water into trach uncontrolled.    Nectar thick liquids tolerated well clinically- suspect due to decreased velocity of liquid flow.  Pt able to "masticate" cracker but has very limited dentition.  Soft diet/ground meats with nectar liquids recommended. Suspect swallow ability to return to baseline with subsequent diet advancement as pt medically improves.    Skilled intervention included determining diet that will be better tolerated by pt.  Spoke with RN re: recommendations. SLP to follow up on Monday 1/11.  Note h/o shingles, dementia  and CVA will increase pt's dysphagia/aspiration risk.      Aspiration Risk  Moderate    Diet Recommendation Dysphagia 3 (Mechanical Soft);Nectar-thick liquid   Liquid Administration via: Cup Medication Administration: Whole meds with puree Supervision: Staff to assist with self feeding Compensations: Slow rate;Small sips/bites Postural Changes and/or Swallow Maneuvers:  (allow rest break if pt coughing or short of breath)    Other  Recommendations Oral Care Recommendations: Oral care BID Other Recommendations: Order thickener from pharmacy   Follow Up Recommendations   (TBD)    Frequency and Duration min 2x/week  1 week   Pertinent Vitals/Pain Afebrile, decreased     Swallow Study Prior Functional Status   see HHX    General Date of Onset: 04/24/14 HPI: 81 y/o ? prior h/o CVA, BPH,HOH,shingles, possible dementia c behavioural issues/+-Bipolar admitted 04/23/14 c Fall + R clavicular,  R Proximal femoral  sp sx this am.  swallow eval ordered on admit. Type of Study: Bedside swallow evaluation Diet Prior to this Study: Thin liquids Temperature Spikes Noted: No Respiratory Status: Room air History of Recent Intubation:  (sx) Behavior/Cognition: Alert;Confused;Impulsive;Doesn't follow directions;Decreased sustained attention Oral Cavity - Dentition: Poor condition (few dentition only, no fxl uppers) Self-Feeding Abilities: Needs assist Patient Positioning: Upright in bed Baseline Vocal Quality: Hoarse Volitional Cough: Cognitively unable to elicit Volitional Swallow: Unable to elicit    Oral/Motor/Sensory Function Overall Oral Motor/Sensory Function:  (no focal cn deficits x minimal lingual deviation lt on protrusion)   Ice [employer]  chips: Impaired Oral Phase Impairments: Reduced lingual movement/coordination;Impaired anterior to posterior transit Pharyngeal Phase Impairments: Suspected delayed Swallow   Thin Liquid Thin Liquid: Impaired Presentation: Self  Fed;Cup;Spoon Oral Phase Impairments: Reduced lingual movement/coordination;Impaired anterior to posterior transit Pharyngeal  Phase Impairments: Cough - Immediate;Throat Clearing - Immediate;Multiple swallows;Decreased hyoid-laryngeal movement    Nectar Thick Nectar Thick Liquid: Within functional limits Presentation: Cup;Self Fed   Honey Thick Honey Thick Liquid: Not tested   Puree Puree: Within functional limits Presentation: Self Fed   Solid   GO    Solid: Impaired Oral Phase Impairments: Reduced lingual movement/coordination;Impaired anterior to posterior transit;Impaired mastication Oral Phase Functional Implications: Other (comment) (prolonged oral transiting) Pharyngeal Phase Impairments: Suspected delayed Swallow;Multiple swallows       Mickie Bail [place], MS [facility] SLP [phone]

## 2014-04-24 NOTE — Transfer of Care (Signed)
Immediate Anesthesia Transfer of Care Note  Patient: Scott Rainbowhomas Cammarano Jr.  Procedure(s) Performed: Procedure(s): INTRAMEDULLARY (IM) NAIL INTERTROCHANTRIC (Right)  Patient Location: PACU  Anesthesia Type:MAC and Spinal  Level of Consciousness: sedated and patient cooperative  Airway & Oxygen Therapy: Patient Spontanous Breathing and Patient connected to face mask oxygen  Post-op Assessment: Report given to PACU RN and Post -op Vital signs reviewed and stable  Post vital signs: Reviewed and stable  Complications: No apparent anesthesia complications

## 2014-04-24 NOTE — Brief Op Note (Signed)
04/23/2014 - 04/24/2014  9:12 AM  PATIENT:  Scott Rainbowhomas Cohick Jr.  81 y.o. male  PRE-OPERATIVE DIAGNOSIS:  RIGHT HIP FRACTURE  POST-OPERATIVE DIAGNOSIS:  RIGHT HIP FRACTURE  PROCEDURE:  Procedure(s): INTRAMEDULLARY (IM) NAIL INTERTROCHANTRIC (Right)  SURGEON:  Surgeon(s) and Role:    * Javier DockerJeffrey C Nikitia Asbill, MD - Primary  PHYSICIAN ASSISTANT:   ASSISTANTS: none   ANESTHESIA:   general  EBL:  Total I/O In: 600 [I.V.:600] Out: 100 [Blood:100]  BLOOD ADMINISTERED:none  DRAINS: none   LOCAL MEDICATIONS USED:  MARCAINE     SPECIMEN:  No Specimen  DISPOSITION OF SPECIMEN:  N/A  COUNTS:  YES  TOURNIQUET:  * No tourniquets in log *  DICTATION: .Other Dictation: Dictation Number [id number]  PLAN OF CARE: Admit to inpatient   PATIENT DISPOSITION:  PACU - hemodynamically stable.   Delay start of Pharmacological VTE agent (>24hrs) due to surgical blood loss or risk of bleeding: no

## 2014-04-24 NOTE — Care Management (Signed)
CARE MANAGEMENT NOTE 04/24/2014  Patient:  Scott Lin,Scott Lin   Account Number:  [id number]  Date Initiated:  04/24/2014  Documentation initiated by:  Antony HasteBENNETT,Linsey Arteaga HARRIS  Subjective/Objective Assessment:   fall, right hip fracture, clavicle fracture     Action/Plan:   Anticipated DC Date:     Anticipated DC Plan:  SKILLED NURSING FACILITY      DC Planning Services  CM consult      Choice offered to / List presented to:             Status of service:  In process, will continue to follow Medicare Important Message given?   (If response is "NO", the following Medicare IM given date fields will be blank) Date Medicare IM given:   Medicare IM given by:   Date Additional Medicare IM given:   Additional Medicare IM given by:    Discharge Disposition:    Per UR Regulation:    If discussed at Long Length of Stay Meetings, dates discussed:    Comments:  04/24/2014 1115 - CM attempted to speak with patient. He did not reply. Patient is calm, has mitts on, post-op.  Spoke with Dr. Mahala MenghiniSamtani. Discharge plan is likely to SNF. SW referral is pending. Rubie Maidrystal Alijah Akram RN BSN CCM [phone]

## 2014-04-25 LAB — CBC
HCT: 29.5 % — ABNORMAL LOW (ref 39.0–52.0)
HEMOGLOBIN: 9.8 g/dL — AB (ref 13.0–17.0)
MCH: 30.7 pg (ref 26.0–34.0)
MCHC: 33.2 g/dL (ref 30.0–36.0)
MCV: 92.5 fL (ref 78.0–100.0)
PLATELETS: 127 10*3/uL — AB (ref 150–400)
RBC: 3.19 MIL/uL — AB (ref 4.22–5.81)
RDW: 13.4 % (ref 11.5–15.5)
WBC: 6.5 10*3/uL (ref 4.0–10.5)

## 2014-04-25 LAB — COMPREHENSIVE METABOLIC PANEL
ALBUMIN: 3.1 g/dL — AB (ref 3.5–5.2)
ALK PHOS: 39 U/L (ref 39–117)
ALT: 20 U/L (ref 0–53)
AST: 23 U/L (ref 0–37)
Anion gap: 6 (ref 5–15)
BUN: 12 mg/dL (ref 6–23)
CO2: 25 mmol/L (ref 19–32)
CREATININE: 0.86 mg/dL (ref 0.50–1.35)
Calcium: 8.1 mg/dL — ABNORMAL LOW (ref 8.4–10.5)
Chloride: 105 mEq/L (ref 96–112)
GFR calc Af Amer: 89 mL/min — ABNORMAL LOW (ref >90)
GFR calc non Af Amer: 77 mL/min — ABNORMAL LOW (ref >90)
GLUCOSE: 106 mg/dL — AB (ref 70–99)
POTASSIUM: 3.6 mmol/L (ref 3.5–5.1)
Sodium: 136 mmol/L (ref 135–145)
Total Bilirubin: 0.8 mg/dL (ref 0.3–1.2)
Total Protein: 5.2 g/dL — ABNORMAL LOW (ref 6.0–8.3)

## 2014-04-25 LAB — RPR: RPR Ser Ql: NONREACTIVE — AB

## 2014-04-25 MED ORDER — TAMSULOSIN HCL 0.4 MG PO CAPS
0.4000 mg | ORAL_CAPSULE | Freq: Every day | ORAL | Status: DC
  Administered 2014-04-25 – 2014-04-26 (×2): 0.4 mg via ORAL
  Filled 2014-04-25 (×3): qty 1

## 2014-04-25 MED ORDER — CITALOPRAM HYDROBROMIDE 20 MG PO TABS
20.0000 mg | ORAL_TABLET | Freq: Every morning | ORAL | Status: DC
  Administered 2014-04-26: 20 mg via ORAL
  Filled 2014-04-25: qty 1

## 2014-04-25 MED ORDER — ENSURE PUDDING PO PUDG
1.0000 | Freq: Three times a day (TID) | ORAL | Status: DC
  Administered 2014-04-25 – 2014-04-26 (×3): 1 via ORAL
  Filled 2014-04-25 (×5): qty 1

## 2014-04-25 NOTE — Progress Notes (Signed)
Left message for Social Work to contact Darl PikesSusan (daughter in law) per her request by 0900 04/26/14 in regards to SNF placement.

## 2014-04-25 NOTE — Progress Notes (Signed)
   Subjective: 1 Day Post-Op Procedure(s) (LRB): INTRAMEDULLARY (IM) NAIL INTERTROCHANTRIC (Right) Patient awake and alert. Does not complain of pain but Im uncertain that he understands any questions    Objective: Vital signs in last 24 hours: Temp:  [97.3 F (36.3 C)-98.8 F (37.1 C)] 97.9 F (36.6 C) (01/10 0522) Pulse Rate:  [71-103] 103 (01/10 0522) Resp:  [13-20] 18 (01/10 0522) BP: (96-120)/(41-55) 106/46 mmHg (01/10 0522) SpO2:  [94 %-100 %] 94 % (01/10 0522)  Intake/Output from previous day:  Intake/Output Summary (Last 24 hours) at 04/25/14 0827 Last data filed at 04/25/14 0217  Gross per 24 hour  Intake   1370 ml  Output   1450 ml  Net    -80 ml    Intake/Output this shift:    Labs:  Recent Labs  04/23/14 2200 04/24/14 0330 04/25/14 0625  HGB 12.2* 11.7* 9.8*    Recent Labs  04/24/14 0330 04/25/14 0625  WBC 8.1 6.5  RBC 3.89* 3.19*  HCT 36.5* 29.5*  PLT 188 127*    Recent Labs  04/24/14 0330 04/25/14 0625  NA 137 136  K 3.7 3.6  CL 101 105  CO2 27 25  BUN 14 12  CREATININE 0.99 0.86  GLUCOSE 106* 106*  CALCIUM 8.7 8.1*   No results for input(s): LABPT, INR in the last 72 hours.  EXAM General - Patient is Confused Extremity - Dorsiflexion/Plantar flexion intact No cellulitis present Compartment soft Dressing - dressing C/D/I Motor Function - intact, moving foot and toes well on exam.    Past Medical History  Diagnosis Date  . Stroke   . Sinusitis   . History of shingles   . BPH (benign prostatic hyperplasia)   . Hiatal hernia   . Hard of hearing     Assessment/Plan: 1 Day Post-Op Procedure(s) (LRB): INTRAMEDULLARY (IM) NAIL INTERTROCHANTRIC (Right) Active Problems:   Clavicle fracture   Femur fracture, right   Dementia with behavioral disturbance   BPH (benign prostatic hyperplasia)   Generalized anxiety disorder   Nonspecific abnormal electrocardiogram (ECG) (EKG)   Advance diet Up with  therapy  Loanne DrillingALUISIO,[place]oyah Ramone V

## 2014-04-25 NOTE — Progress Notes (Signed)
Unable to keep safety sitter at bedside past 24 hours. Order only required Recruitment consultantsafety sitter for 24 hours. Due to staffing,  we removed safety sitter at 0930 and placed safety precautions for patient including sensitive bed alarm, near nursing station, yellow socks on feet, three side rails up, call bell in reach and frequent orientation by nursing staff. Will continue to monitor.

## 2014-04-25 NOTE — Progress Notes (Signed)
INITIAL NUTRITION ASSESSMENT  DOCUMENTATION CODES Per approved criteria  -Not Applicable   INTERVENTION: -Provide Ensure Pudding po TID, each supplement provides 170 kcal and 4 grams of protein -Provide Magic cup TID with meals, each supplement provides 290 kcal and 9 grams of protein -RD continue to monitor  NUTRITION DIAGNOSIS: Increased nutrient (protein) needs related to healing from surgery as evidenced by clavicle and femur fractures.   Goal: Pt to meet >/= 90% of their estimated nutrition needs   Monitor:  PO and supplemental intake, weight, labs, I/O's  Reason for Assessment: Consult for nutritional assessment  Admitting Dx: fall, right hip fracture, clavicle fracture  ASSESSMENT: Pt fell at home and suffered R clavicle and R proximal femur fx and is now s/p IM nailing R LE. Pt with hx of Stroke; Sinusitis; History of shingles; BPH (benign prostatic hyperplasia); Hiatal hernia; and Hard of hearing and dementia.  S/p 1/09 NTRAMEDULLARY (IM) NAIL INTERTROCHANTRIC (Right)  Pt in room with no family present. Pt unable to provide history. RD spoke with tech, states that pt is eating well except he still is unable to chew the chopped/ground meats provided by his diet. Pt possibly needs pureed meats.   Per SLP note, recommend dysphagia 3 diet with ground meats and nectar thick liquids. Pt with moderate risk of aspiration. PO intake: 75%  Given multiple fractures and swallowing difficulty pt is at nutritional risk. Pt may benefit from nutritional supplementation. RD to order.  Pt with severe muscle and fat depletion in facial and arm regions. Unable to fully assess as pt asleep and covered up.  Labs reviewed:WNL  Height: Ht Readings from Last 1 Encounters:  04/23/14 6' (1.829 m)    Weight: Wt Readings from Last 1 Encounters:  04/23/14 137 lb 5.6 oz (62.3 kg)    Ideal Body Weight: 178 lb  % Ideal Body Weight: 77%  Wt Readings from Last 10 Encounters:  04/23/14  137 lb 5.6 oz (62.3 kg)    Usual Body Weight: unable to obtain  BMI:  Body mass index is 18.62 kg/(m^2).  Estimated Nutritional Needs: Kcal: 1550-1750 Protein: 90-100g Fluid: 1.6L/day  Skin: hip incision  Diet Order: DIET DYS 3 w/ ground meats and  nectar thick liquids  EDUCATION NEEDS: -No education needs identified at this time   Intake/Output Summary (Last 24 hours) at 04/25/14 1405 Last data filed at 04/25/14 0848  Gross per 24 hour  Intake    560 ml  Output   1350 ml  Net   -790 ml    Last BM: PTA  Labs:   Recent Labs Lab 04/23/14 2200 04/24/14 0330 04/25/14 0625  NA 133* 137 136  K 3.3* 3.7 3.6  CL 103 101 105  CO2 27 27 25   BUN 12 14 12   CREATININE 0.88 0.99 0.86  CALCIUM 8.4 8.7 8.1*  MG 2.0  --   --   PHOS 3.1  --   --   GLUCOSE 106* 106* 106*    CBG (last 3)  No results for input(s): GLUCAP in the last 72 hours.  Scheduled Meds: . antiseptic oral rinse  7 mL Mouth Rinse q12n4p  . aspirin EC  325 mg Oral Q breakfast  . chlorhexidine  15 mL Mouth Rinse BID  . cholecalciferol  1,000 Units Oral Daily  . docusate sodium  100 mg Oral BID  . gabapentin  300 mg Oral BID  . Influenza vac split quadrivalent PF  0.5 mL Intramuscular Tomorrow-1000  . LORazepam  0.5 mg Oral QHS  . LORazepam  0.5 mg Oral Q breakfast  . tamsulosin  0.4 mg Oral Daily    Continuous Infusions:   Past Medical History  Diagnosis Date  . Stroke   . Sinusitis   . History of shingles   . BPH (benign prostatic hyperplasia)   . Hiatal hernia   . Hard of hearing     Past Surgical History  Procedure Laterality Date  . Prostate surgery  2013    Tilda Franco, MS, RD, LDN Pager: [phone] After Hours Pager: [phone]

## 2014-04-25 NOTE — Evaluation (Signed)
Physical Therapy Evaluation Patient Details Name: Scott Rainbowhomas Moncure Jr. MRN: [id number] DOB: 10/24/1926 Today's Date: 04/25/2014   History of Present Illness  Pt fell at home and suffered R clavicle and R proximal femur fx and is now s/p IM nailing R LE. Pt with hx of Stroke; Sinusitis; History of shingles; BPH (benign prostatic hyperplasia); Hiatal hernia; and Hard of hearing and dementia.  Clinical Impression  Patient is s/p above surgery resulting in functional limitations due to the deficits listed below (see PT Problem List).  Patient will benefit from skilled PT to increase their independence and safety with mobility to allow discharge to the venue listed below.  Spoke to family and they are in agreement with SNF recommendation.     Follow Up Recommendations SNF    Equipment Recommendations  None recommended by PT    Recommendations for Other Services       Precautions / Restrictions Precautions Precautions: Fall Restrictions Weight Bearing Restrictions: Yes RLE Weight Bearing: Non weight bearing      Mobility  Bed Mobility Overal bed mobility: Needs Assistance;+2 for physical assistance Bed Mobility: Supine to Sit;Sit to Supine;Rolling Rolling: Max assist   Supine to sit: Max assist;+2 for physical assistance Sit to supine: Max assist;+2 for physical assistance   General bed mobility comments: Use of draw pad  to get hips to edge of bed and multi-modal cueing to not WB through R UE.  Pt reported dizziness initially.  Sat EOB with CGA while sling was donned and he performed knee ext and ankle pumps.  Returned supine with MAX of 2 and again reports of dizziness which subsided after 1 minute  Transfers                 General transfer comment: Unable to stand due to pain, weakness, and NWB status.  Nursing aware to use mechanical lift for OOB when family or sitter is present.  Ambulation/Gait                Stairs            Wheelchair Mobility     Modified Rankin (Stroke Patients Only)       Balance Overall balance assessment: Needs assistance   Sitting balance-Leahy Scale: Fair       Standing balance-Leahy Scale: Zero                               Pertinent Vitals/Pain Pain Assessment: Faces Faces Pain Scale: Hurts even more Pain Location: R leg and R clavicle with bed mobility Pain Intervention(s): Limited activity within patient's tolerance;Repositioned    Home Living Family/patient expects to be discharged to:: Skilled nursing facility                      Prior Function           Comments: Lived alone.     Hand Dominance        Extremity/Trunk Assessment   Upper Extremity Assessment: Defer to OT evaluation           Lower Extremity Assessment: RLE deficits/detail RLE Deficits / Details: quads 3/5, hip flex 2-/5       Communication   Communication: HOH  Cognition Arousal/Alertness: Awake/alert Behavior During Therapy: WFL for tasks assessed/performed Overall Cognitive Status: History of cognitive impairments - at baseline       Memory: Decreased short-term memory  General Comments General comments (skin integrity, edema, etc.): Pt extremely HOH impacting communication and education re: R LE NWB status and use of sling    Exercises General Exercises - Lower Extremity Ankle Circles/Pumps: Both;10 reps;Seated Long Arc Quad: Strengthening;Both;10 reps Hip Flexion/Marching: Strengthening;Left;10 reps      Assessment/Plan    PT Assessment Patient needs continued PT services  PT Diagnosis Difficulty walking;Acute pain;Generalized weakness   PT Problem List Decreased strength;Decreased range of motion;Decreased activity tolerance;Decreased balance;Decreased mobility;Decreased knowledge of use of DME  PT Treatment Interventions DME instruction;Functional mobility training;Therapeutic activities;Therapeutic exercise;Balance training;Neuromuscular  re-education   PT Goals (Current goals can be found in the Care Plan section) Acute Rehab PT Goals Patient Stated Goal: Go to rehab PT Goal Formulation: With family Time For Goal Achievement: 05/02/14 Potential to Achieve Goals: Good    Frequency Min 3X/week   Barriers to discharge        Co-evaluation               End of Session   Activity Tolerance: Patient limited by fatigue;Patient tolerated treatment well Patient left: in bed;with call bell/phone within reach;with bed alarm set Nurse Communication: Mobility status;Need for lift equipment;Weight bearing status         Time: 0927-0951 PT Time Calculation (min) (ACUTE ONLY): 24 min   Charges:   PT Evaluation $Initial PT Evaluation Tier I: 1 Procedure PT Treatments $Therapeutic Activity: 8-22 mins   PT G Codes:        Burrell Hodapp LUBECK 04/25/2014, 12:57 PM

## 2014-04-25 NOTE — Progress Notes (Signed)
Scott Rainbowhomas Spratlin Jr. [id number] DOB: 02/08/1927 DOA: 04/23/2014 PCP: Toma DeitersHASANAJ,XAJE A, MD  Brief narrative: 81 y/o ? prior h/o CVA, BPH,HOH,shingles, possible dementia c behavioural issues/+-Bipolar admitted 04/23/14 c Fall + R clavicular #/R Proximal femoral #  Past medical history-As per Problem list Chart reviewed as below- reviewed  Consultants:  Psychiatry  Ortho  Procedures:  R Im nail 1/9  Antibiotics:  none   Subjective  Calm, sleeping Nursing reports no issues  Eatign when fed A little confused   Objective    Interim History:   Telemetry:    Objective: Filed Vitals:   04/25/14 0800 04/25/14 1200 04/25/14 1419 04/25/14 1528  BP:   92/50   Pulse:   88   Temp:   99 F (37.2 C)   TempSrc:   Oral   Resp: 16 16 17 20   Height:      Weight:      SpO2: 97% 92% 95%     Intake/Output Summary (Last 24 hours) at 04/25/14 1809 Last data filed at 04/25/14 1730  Gross per 24 hour  Intake    870 ml  Output   1850 ml  Net   -980 ml    Exam:  General: eomi, sleepy Cardiovascular: s1 s 2no m/r/g Respiratory:  Clear  Data Reviewed: Basic Metabolic Panel:  Recent Labs Lab 04/23/14 2200 04/24/14 0330 04/25/14 0625  NA 133* 137 136  K 3.3* 3.7 3.6  CL 103 101 105  CO2 27 27 25   GLUCOSE 106* 106* 106*  BUN 12 14 12   CREATININE 0.88 0.99 0.86  CALCIUM 8.4 8.7 8.1*  MG 2.0  --   --   PHOS 3.1  --   --    Liver Function Tests:  Recent Labs Lab 04/23/14 2200 04/25/14 0625  AST 25 23  ALT 21 20  ALKPHOS 47 39  BILITOT 1.2 0.8  PROT 6.6 5.2*  ALBUMIN 4.2 3.1*   No results for input(s): LIPASE, AMYLASE in the last 168 hours. No results for input(s): AMMONIA in the last 168 hours. CBC:  Recent Labs Lab 04/23/14 2200 04/24/14 0330 04/25/14 0625  WBC 7.5 8.1 6.5  HGB 12.2* 11.7* 9.8*  HCT 37.4* 36.5* 29.5*  MCV 92.6 93.8 92.5  PLT 183 188 127*   Cardiac Enzymes:  Recent Labs Lab 04/23/14 2200 04/24/14 0330 04/24/14 1110    CKTOTAL 1022*  --   --   TROPONINI 0.03 0.03 0.03   BNP: Invalid input(s): POCBNP CBG: No results for input(s): GLUCAP in the last 168 hours.  No results found for this or any previous visit (from the past 240 hour(s)).   Studies:              All Imaging reviewed and is as per above notation   Scheduled Meds: . antiseptic oral rinse  7 mL Mouth Rinse q12n4p  . aspirin EC  325 mg Oral Q breakfast  . chlorhexidine  15 mL Mouth Rinse BID  . cholecalciferol  1,000 Units Oral Daily  . docusate sodium  100 mg Oral BID  . feeding supplement (ENSURE)  1 Container Oral TID BM  . gabapentin  300 mg Oral BID  . Influenza vac split quadrivalent PF  0.5 mL Intramuscular Tomorrow-1000  . LORazepam  0.5 mg Oral QHS  . LORazepam  0.5 mg Oral Q breakfast  . tamsulosin  0.4 mg Oral Daily   Continuous Infusions:     Assessment/Plan:  1. Fall c Hip #R s/p IM  nail 1/9-stable-ortho determine pain control, AC, WBAT orders etc. Will need SNF- AM cbc Abla 2/2 to dilutional anemia and hip surgery-expected blood loss-Hb dropped 12.2 ? 9.8.  Monitor in am 2. Severe dementia + behavioural dist +/- anxiety-Psych MD input appreciated-Ativan started pe rPsych MDlong term-on Celexa 20 usually 3. R clavicular #-Apllied sling-OP re-eval 4. ?dysphagia-Dys 3 diet, nectar thickened-nursing to assist c feeds 5.  prior CVA-start ASA 325 1/10 6. BPH-continue FLomax 0.4 qd 7. Neuropathy-continue Gabapentin bid  Code Status: presumed full Family Communication: none bedside-called  Disposition Plan: D/c to SNF in am    Pleas Koch, MD  [facility] Pager [phone] 04/25/2014, 6:09 PM    LOS: 2 days

## 2014-04-26 ENCOUNTER — Encounter (HOSPITAL_COMMUNITY): Payer: Self-pay | Admitting: Specialist

## 2014-04-26 DIAGNOSIS — S7291XA Unspecified fracture of right femur, initial encounter for closed fracture: Secondary | ICD-10-CM

## 2014-04-26 DIAGNOSIS — S42009A Fracture of unspecified part of unspecified clavicle, initial encounter for closed fracture: Secondary | ICD-10-CM

## 2014-04-26 DIAGNOSIS — R9431 Abnormal electrocardiogram [ECG] [EKG]: Secondary | ICD-10-CM

## 2014-04-26 DIAGNOSIS — S72141A Displaced intertrochanteric fracture of right femur, initial encounter for closed fracture: Secondary | ICD-10-CM | POA: Diagnosis not present

## 2014-04-26 LAB — CBC WITH DIFFERENTIAL/PLATELET
BASOS ABS: 0 10*3/uL (ref 0.0–0.1)
BASOS PCT: 0 % (ref 0–1)
Eosinophils Absolute: 0.3 10*3/uL (ref 0.0–0.7)
Eosinophils Relative: 4 % (ref 0–5)
HCT: 29.2 % — ABNORMAL LOW (ref 39.0–52.0)
Hemoglobin: 9.6 g/dL — ABNORMAL LOW (ref 13.0–17.0)
LYMPHS ABS: 1.3 10*3/uL (ref 0.7–4.0)
LYMPHS PCT: 22 % (ref 12–46)
MCH: 30.6 pg (ref 26.0–34.0)
MCHC: 32.9 g/dL (ref 30.0–36.0)
MCV: 93 fL (ref 78.0–100.0)
MONOS PCT: 12 % (ref 3–12)
Monocytes Absolute: 0.7 10*3/uL (ref 0.1–1.0)
Neutro Abs: 3.6 10*3/uL (ref 1.7–7.7)
Neutrophils Relative %: 62 % (ref 43–77)
Platelets: 117 10*3/uL — ABNORMAL LOW (ref 150–400)
RBC: 3.14 MIL/uL — AB (ref 4.22–5.81)
RDW: 13.5 % (ref 11.5–15.5)
WBC: 5.8 10*3/uL (ref 4.0–10.5)

## 2014-04-26 LAB — FOLATE RBC
FOLATE, HEMOLYSATE: 474.2 ng/mL
FOLATE, RBC: 1282 ng/mL (ref >498)
Hematocrit: 37 % — ABNORMAL LOW (ref 37.5–51.0)

## 2014-04-26 LAB — BASIC METABOLIC PANEL
Anion gap: 5 (ref 5–15)
BUN: 11 mg/dL (ref 6–23)
CALCIUM: 8 mg/dL — AB (ref 8.4–10.5)
CO2: 27 mmol/L (ref 19–32)
CREATININE: 0.74 mg/dL (ref 0.50–1.35)
Chloride: 105 mEq/L (ref 96–112)
GFR calc non Af Amer: 82 mL/min — ABNORMAL LOW (ref >90)
Glucose, Bld: 105 mg/dL — ABNORMAL HIGH (ref 70–99)
Potassium: 3.7 mmol/L (ref 3.5–5.1)
SODIUM: 137 mmol/L (ref 135–145)

## 2014-04-26 LAB — VITAMIN D 25 HYDROXY (VIT D DEFICIENCY, FRACTURES): Vit D, 25-Hydroxy: 43.7 ng/mL (ref 30.0–100.0)

## 2014-04-26 MED ORDER — HYDROCODONE-ACETAMINOPHEN 5-325 MG PO TABS: 1.0000 | ORAL_TABLET | Freq: Four times a day (QID) | ORAL | Status: DC | PRN

## 2014-04-26 MED ORDER — HYDROCODONE-ACETAMINOPHEN 5-325 MG PO TABS: 1.0000 | ORAL_TABLET | Freq: Four times a day (QID) | ORAL | Status: AC | PRN

## 2014-04-26 MED ORDER — RESOURCE THICKENUP CLEAR PO POWD: 1.0000 | ORAL | Status: AC | PRN

## 2014-04-26 MED ORDER — LACTULOSE 10 GM/15ML PO SOLN
30.0000 g | Freq: Two times a day (BID) | ORAL | Status: DC
  Filled 2014-04-26 (×2): qty 45

## 2014-04-26 MED ORDER — LACTULOSE 10 GM/15ML PO SOLN: 30.0000 g | Freq: Two times a day (BID) | ORAL | Status: AC

## 2014-04-26 NOTE — Op Note (Signed)
NAMDe Burrs:  Scott Lin, Scott Lin                ACCOUNT NO.:  [id number]  MEDICAL RECORD NO.:  [id number]  LOCATION:                               [facility]  PHYSICIAN:  Jene EveryJeffrey Haskell Rihn, M.D.    DATE OF BIRTH:  [birth year]  DATE OF PROCEDURE:  04/24/2014 DATE OF DISCHARGE:                              OPERATIVE REPORT   PREOPERATIVE DIAGNOSIS:  Right comminuted basicervical intertrochanteric hip fracture.  POSTOPERATIVE DIAGNOSIS:  Right comminuted basicervical intertrochanteric hip fracture.  PROCEDURE PERFORMED:  Open reduction and internal fixation with intramedullary nailing of the femur.  ANESTHESIA:  General.  ASSISTANT:  No assistant.  We used a [employer] Affixus screw short with a 90 mm lag screw, 70 mm derotation screw, and a 38 cortical screw distally.  HISTORY:  An 7685, fell, displaced fracture, indicated for fixation, was confused.  We discussed ORIF and possibility of converting that to a hemiarthroplasty.  Also, DVT, PE, anesthetic complications, etc.  TECHNIQUE:  With the patient in supine position, after induction of adequate spinal anesthesia and 2 g Kefzol, he was placed on the fracture table.  All bony prominences were well padded.  Foley to gravity. Perineal __________, right lower extremity and longitudinal __________ internal rotation, we performed a reduction maneuver.  The right hip and peritrochanteric regions were prepped and draped in usual sterile fashion and incision was made proximal to the trochanter through the fascia.  Electrocautery was utilized to achieve the hemostasis.  Tip of the trochanter was identified.  I placed a guide pin into the canal under x-ray AP and lateral.  We over reamed it proximally.  I selected a 13 mm diameter screw due to the diameter of the shaft.  This was inserted with an external alignment guide without difficulty.  I then made an incision laterally and inserted a guide pin up into the femoral head and neck with excellent  placement slightly inferior and posterior and measured, they were 90, we drilled at 90, set it with a 90 mm lag screw.  We used a 125 angled device.  We had excellent purchase.  We then relieved the traction and we compressed at the fracture site. Prior to this, we put a derotation screw proximal to it after drilling, insertion of 70 mm lag screw.  We then locked it proximally, backed off quarter turn.  It was reduced in the AP and lateral plane.  Distally, we placed a locking screw in the static slot and stab incision laterally, external alignment guide.  We drilled the distal locking hole distally, placed a 38 mm screw bicortical with excellent purchase in the AP and lateral plane with excellent reduction of the fracture and placement of the hardware.  All wounds were copiously irrigated.  We closed the fascia with 1 Vicryl, subcu with 2-0, and skin with staples.  Wound was dressed sterilely.  He was removed from the fracture table without difficulty.  Leg lengths were equivalent.  Good pulses, appropriate rotation, and transported to the recovery room in satisfactory condition.  The patient tolerated the procedure well.  No complications.  Blood loss was 50 mL.     Jene EveryJeffrey Ericha Whittingham, M.D.  JB/MEDQ  D:  04/24/2014  T:  04/24/2014  Job:  [id number]

## 2014-04-26 NOTE — Discharge Summary (Signed)
Physician Discharge Summary  Scott Rainbowhomas Steers Jr. [id number] DOB: 07/13/1926 DOA: 04/23/2014  PCP: Toma DeitersHASANAJ,XAJE A, MD  Admit date: 04/23/2014 Discharge date: 04/26/2014  Time spent: 35 minutes  Recommendations for Outpatient Follow-up:  1. Patient will need close follow-up with Dr. Shelle IronBeane office for right clavicular fracture-continue sliding. 2. Patient discharged to skilled nursing facility for potential need for therapy for right hip fracture 3. Patient should be on aspirin 325 mg daily for secondary prevention DVT 4.  Nonweightbearing status right upper and right lower extremity per orthopedics 5. Consider geriatric/Geri psychiatry input as an outpatient given patient's mild dementia, bipolar 6. Repeat CBC plus differential, complete metabolic panel 1 week 7. Dysphagia 3 diet nectar thick liquids 8. Patient may require indwelling Foley until outpatient urology appointment can be arranged if patient fails voiding trial in the hospital  Discharge Diagnoses:  Active Problems:   Clavicle fracture   Femur fracture, right   Dementia with behavioral disturbance   BPH (benign prostatic hyperplasia)   Generalized anxiety disorder   Nonspecific abnormal electrocardiogram (ECG) (EKG)   Discharge Condition: Fair to guarded  Diet recommendation: See above  [facility]   04/23/14 2100 04/23/14 2145  Weight: 64.864 kg (143 lb) 62.3 kg (137 lb 5.6 oz)    History of present illness:  81 year old male, history CVA, BPH, shingles, possible dementia with evidence of bipolar admitted 04/23/14 with fall + right clavicular fracture/right proximal femoral fracture Underwent right IM nailing 04/24/14 See below. Problem list  Hospital Course:   1. Fall c Hip #R s/p IM nail 1/9-stable-ortho determine pain control, AC, WBAT orders etc. Will need SNF as OP 4. Abla 2/2 to dilutional anemia and hip surgery-expected blood loss-Hb dropped 12.2 ? 9.8. CBC as OP 2. Severe dementia + behavioural dist +/-  anxiety-Psych MD input appreciated-Ativan started per Psych MD long term-on Celexa 20 usually 3. R clavicular #-Apllied sling-OP re-eval Dr. Shelle IronBeane in about 2 weeks 4. ?dysphagia-speech therapy recommended Dys 3 diet, nectar thickened-nursing to assist c feeds at facility 5. prior CVA-start ASA 325 1/10 6. BPH-continue FLomax 0.4 qd-Foley catheter was clamped to discharge. If patient does not experience a feeling to void, patient will need to have urology outpatient follow-up and urodynamic studies. 7. Constipation induced by narcotics-cut back dose of hydrocodone on discharge from 2-1 tablet of Norco. 8. Neuropathy-continue Gabapentin bid  Procedures:  r Im nailing 1/9  Consultations:  Orthopedics  Discharge Exam: Filed Vitals:   04/26/14 0551  BP: 115/50  Pulse: 80  Temp: 97.5 F (36.4 C)  Resp: 18    General: Much more alert, oriented. Can tell me [place].  No specific pain this morning but is complaining of significant constipation  Cardiovascular: S1-S2 no murmur rub or gallop Respiratory: Clinically clear Orthopedic wound not examined  Discharge Instructions   Discharge Instructions    Diet - low sodium heart healthy    Complete by:  As directed      Increase activity slowly    Complete by:  As directed      Non weight bearing    Complete by:  As directed           Current Discharge Medication List    START taking these medications   Details  docusate sodium (COLACE) 100 MG capsule Take 1 capsule (100 mg total) by mouth 2 (two) times daily as needed for mild constipation. Qty: 20 capsule, Refills: 1    HYDROcodone-acetaminophen (NORCO/VICODIN) 5-325 MG per  tablet Take 1 tablet by mouth every 6 (six) hours as needed for moderate pain. Qty: 60 tablet, Refills: 0    Maltodextrin-Xanthan Gum (RESOURCE THICKENUP CLEAR) POWD Take 120 g by mouth as needed.      CONTINUE these medications which have CHANGED   Details  aspirin  EC 325 MG tablet Take 1 tablet (325 mg total) by mouth daily. Qty: 30 tablet, Refills: 0      CONTINUE these medications which have NOT CHANGED   Details  cholecalciferol (VITAMIN D) 1000 UNITS tablet Take 1,000 Units by mouth daily.    citalopram (CELEXA) 20 MG tablet Take 20 mg by mouth every morning.     gabapentin (NEURONTIN) 300 MG capsule Take 300 mg by mouth 2 (two) times daily.    LORazepam (ATIVAN) 0.5 MG tablet Take 0.5 mg by mouth daily with breakfast.  Refills: 2    tamsulosin (FLOMAX) 0.4 MG CAPS capsule Take 0.4 mg by mouth every morning.        No Known Allergies Follow-up Information    Follow up with Javier Docker, MD.   Specialty:  Orthopedic Surgery   Contact information:   [address] [place] [address] [phone]        The results of significant diagnostics from this hospitalization (including imaging, microbiology, ancillary and laboratory) are listed below for reference.    Significant Diagnostic Studies: Dg Hip Operative Unilat With Pelvis Right  04/24/2014   CLINICAL DATA:  Internal fixation of right femoral fracture.  EXAM: DG HIP OPERATIVE W/ PELVIS*L*  COMPARISON:  Right hip radiograph performed 04/23/2014  FINDINGS: The patient is status post placement of a dynamic hip screw across the proximal right femur, transfixing the previously noted comminuted fracture in grossly anatomic alignment. No new fractures are seen. The right femoral head remains seated at the acetabulum.  IMPRESSION: Status post internal fixation of comminuted proximal right femoral fracture in grossly anatomic alignment.   Electronically Signed   By: Roanna Raider M.D.   On: 04/24/2014 09:24    Microbiology: Recent Results (from the past 240 hour(s))  Urine culture     Status: None (Preliminary result)   Collection Time: 04/24/14  1:35 AM  Result Value Ref Range Status   Specimen Description URINE, CATHETERIZED  Final   Special Requests NONE  Final    Colony Count PENDING  Incomplete   Culture   Final    Culture reincubated for better growth Performed at [facility]    Report Status PENDING  Incomplete     Labs: Basic Metabolic Panel:  Recent Labs Lab 04/23/14 2200 04/24/14 0330 04/25/14 0625 04/26/14 0433  NA 133* 137 136 137  K 3.3* 3.7 3.6 3.7  CL 103 101 105 105  CO2 GLUCOSE 106* 106* 106* 105*  BUN CREATININE 0.88 0.99 0.86 0.74  CALCIUM 8.4 8.7 8.1* 8.0*  MG 2.0  --   --   --   PHOS 3.1  --   --   --    Liver Function Tests:  Recent Labs Lab 04/23/14 2200 04/25/14 0625  AST 25 23  ALT 21 20  ALKPHOS 47 39  BILITOT 1.2 0.8  PROT 6.6 5.2*  ALBUMIN 4.2 3.1*   No results for input(s): LIPASE, AMYLASE in the last 168 hours. No results for input(s): AMMONIA in the last 168 hours. CBC:  Recent Labs Lab 04/23/14 2200 04/24/14 0330 04/25/14 0625 04/26/14  0433  WBC 7.5 8.1 6.5 5.8  NEUTROABS  --   --   --  3.6  HGB 12.2* 11.7* 9.8* 9.6*  HCT 37.4* 36.5* 29.5* 29.2*  MCV 92.6 93.8 92.5 93.0  PLT 183 188 127* 117*   Cardiac Enzymes:  Recent Labs Lab 04/23/14 2200 04/24/14 0330 04/24/14 1110  CKTOTAL 1022*  --   --   TROPONINI 0.03 0.03 0.03   BNP: BNP (last 3 results) No results for input(s): PROBNP in the last 8760 hours. CBG: No results for input(s): GLUCAP in the last 168 hours.     SignedRhetta Mura  [facility] 04/26/2014, 10:16 AM

## 2014-04-26 NOTE — Progress Notes (Signed)
SLP Cancellation Note  Patient Details Name: Scott Rainbowhomas Esterline Jr. MRN: [id number] DOB: 07/02/1926   Cancelled treatment:       Reason Eval/Treat Not Completed:  (pt currently being discharged from hospital, recommend follow up at SNF for dysphagia management) NT reports good tolerance of breakfast.    Donavan Burnetamara Milan Perkins, MS [facility] SLP [phone]

## 2014-04-26 NOTE — Progress Notes (Signed)
Patient is set to [facility] today. Patient & daughter-in-law, Scott Lin aware. Discharge packet given to RN, Charline BillsKristyn. [facility] called for transport.   Clinical Social Work Department CLINICAL SOCIAL WORK PLACEMENT NOTE 04/26/2014  Patient:  Scott Lin,Scott Lin  Account Number:  [id number] Admit date:  04/23/2014  Clinical Social Worker:  Orpah GreekKELLY FOLEY, LCSWA  Date/time:  04/26/2014 11:35 AM  Clinical Social Work is seeking post-discharge placement for this patient at the following level of care:   SKILLED NURSING   (*CSW will update this form in Epic as items are completed)   04/26/2014  Patient/family provided with [facility] Department of Clinical Social Work's list of facilities offering this level of care within the geographic area requested by the patient (or if unable, by the patient's family).  04/26/2014  Patient/family informed of their freedom to choose among providers that offer the needed level of care, that participate in Medicare, Medicaid or managed care program needed by the patient, have an available bed and are willing to accept the patient.  04/26/2014  Patient/family informed of [facility]' ownership interest in [facility], as well as of the fact that they are under no obligation to receive care at this facility.  PASARR submitted to EDS on 04/26/2014 PASARR number received on 04/26/2014  FL2 transmitted to all facilities in geographic area requested by pt/family on  04/26/2014 FL2 transmitted to all facilities within larger geographic area on   Patient informed that his/her managed care company has contracts with or will negotiate with  certain facilities, including the following:     Patient/family informed of bed offers received:  04/26/2014 [facility] Physician recommends and patient chooses bed at    Patient to be transferred [facility] on  04/26/2014 Patient to be transferred to facility  by [facility] Patient and family notified of transfer on 04/26/2014 Name of family member notified:  patient's daughter-in-law, Scott Lin via phone  The following physician request were entered in Epic:   Additional Comments:   Lincoln MaxinKelly Vittorio Mohs, LCSW [facility] Clinical Social Worker cell #: [phone]

## 2014-04-26 NOTE — Progress Notes (Signed)
Subjective: 2 Days Post-Op Procedure(s) (LRB): INTRAMEDULLARY (IM) NAIL INTERTROCHANTRIC (Right) Patient resting this AM no complaints. Hx dementia per notes. Also with R clavicle fx. Sling in place.  Objective: Vital signs in last 24 hours: Temp:  [97.5 F (36.4 C)-99.2 F (37.3 C)] 97.5 F (36.4 C) (01/11 0551) Pulse Rate:  [79-88] 80 (01/11 0551) Resp:  [16-20] 18 (01/11 0551) BP: (92-115)/(50-52) 115/50 mmHg (01/11 0551) SpO2:  [92 %-97 %] 93 % (01/11 0551)  Intake/Output from previous day: 01/10 0701 - 01/11 0700 In: 720 [P.O.:720] Out: 1200 [Urine:1200] Intake/Output this shift:     Recent Labs  04/23/14 2200 04/24/14 0330 04/25/14 0625 04/26/14 0433  HGB 12.2* 11.7* 9.8* 9.6*    Recent Labs  04/25/14 0625 04/26/14 0433  WBC 6.5 5.8  RBC 3.19* 3.14*  HCT 29.5* 29.2*  PLT 127* 117*    Recent Labs  04/25/14 0625 04/26/14 0433  NA 136 137  K 3.6 3.7  CL 105 105  CO2 25 27  BUN 12 11  CREATININE 0.86 0.74  GLUCOSE 106* 105*  CALCIUM 8.1* 8.0*   No results for input(s): LABPT, INR in the last 72 hours.  Neurologically intact ABD soft Neurovascular intact Sensation intact distally Intact pulses distally Dorsiflexion/Plantar flexion intact Incision: dressing C/D/I and no drainage No cellulitis present Compartment soft no sign of DVT  Sling in place RUE  Assessment/Plan: 2 Days Post-Op Procedure(s) (LRB): INTRAMEDULLARY (IM) NAIL INTERTROCHANTRIC (Right) Advance diet Up with therapy D/C IV fluids  Continue NWB status RUE and RLE Eventual D/C to SNF when medically stable per admitting service Follow up 10-14 days post-op in office with Dr. Shelle IronBeane for xrays and staple removal  BISSELL, JACLYN M. 04/26/2014, 7:51 AM

## 2014-04-26 NOTE — Progress Notes (Signed)
Foley clamped per MD order. [facility] arrived for transport to SNF during clamp trial. Foley will be managed at SNF. Julio SicksK. Denica Web RN

## 2014-04-26 NOTE — Evaluation (Signed)
Occupational Therapy Evaluation Patient Details Name: Scott Lin. MRN: [id number] DOB: [birth year] Today's Date: 04/26/2014    History of Present Illness Pt fell at home and suffered R clavicle and R proximal femur fx. Pt with hx of Stroke; Sinusitis; History of shingles; BPH (benign prostatic hyperplasia); Hiatal hernia; and Hard of hearing and dementia.   Clinical Impression   Pt s/p fall. Pt currently with functional limitations due to the deficits listed below (see OT Problem List).  Pt will benefit from skilled OT to increase their safety and independence with ADL and functional mobility for ADL to facilitate discharge to venue listed below.     Follow Up Recommendations  SNF    Equipment Recommendations  None recommended by OT    Recommendations for Other Services       Precautions / Restrictions Precautions Precautions: Fall Restrictions Weight Bearing Restrictions: Yes RLE Weight Bearing: Non weight bearing      Mobility Bed Mobility Overal bed mobility: + 2 for safety/equipment Bed Mobility: Rolling Rolling: Max assist   Supine to sit: Max assist        Transfers                 General transfer comment: NT    Balance                                            ADL Overall ADL's : Needs assistance/impaired                                       General ADL Comments: Pt needs significant A with ADL activity due to R clavicle fracture. Educated pt in using moving his hand and fingers , as well as correct position of sling.  Pt very HOH.  Will need further education     Vision   Perception   Praxis    Pertinent Vitals/Pain Pain Assessment: No/denies pain     Hand Dominance     Extremity/Trunk Assessment Upper Extremity Assessment Upper Extremity Assessment: RUE deficits/detail RUE Deficits / Details: R clavicle fracture.  Hand and wrist WFL. shoulder in sling           Communication  Communication Communication: HOH   Cognition Arousal/Alertness: Awake/alert Behavior During Therapy: WFL for tasks assessed/performed Overall Cognitive Status: History of cognitive impairments - at baseline       Memory: Decreased short-term memory             General Comments               Home Living Family/patient expects to be discharged to:: Skilled nursing facility                                        Prior Functioning/Environment          Comments: Lived alone.    OT Diagnosis: Generalized weakness   OT Problem List: Decreased strength;Decreased range of motion;Decreased activity tolerance;Impaired UE functional use   OT Treatment/Interventions:      OT Goals(Current goals can be found in the care plan section) Acute Rehab OT Goals Patient Stated Goal: Go to rehab OT Goal Formulation: With patient  OT Frequency:  End of Session    Activity Tolerance: Patient limited by fatigue Patient left: in bed;with bed alarm set;with call bell/phone within reach   Time: [id number] OT Time Calculation (min): 17 min Charges:  OT General Charges $OT Visit: 1 Procedure G-Codes:    Einar CrowEDDING, Rondi Ivy D 04/26/2014, 12:51 PM

## 2014-04-27 LAB — URINE CULTURE

## 2014-09-15 DEATH — deceased

## 2015-09-25 IMAGING — RF DG HIP (WITH PELVIS) OPERATIVE*R*
1 series · 4 of 4 positions shown · non-contrast
Comparison: Right hip radiograph performed 04/23/2014

CLINICAL DATA: Internal fixation of right femoral fracture.

EXAM:
DG HIP OPERATIVE W/ PELVIS*L*

[Series 1: run · 4 of 4 slices shown]
[im 1/4]
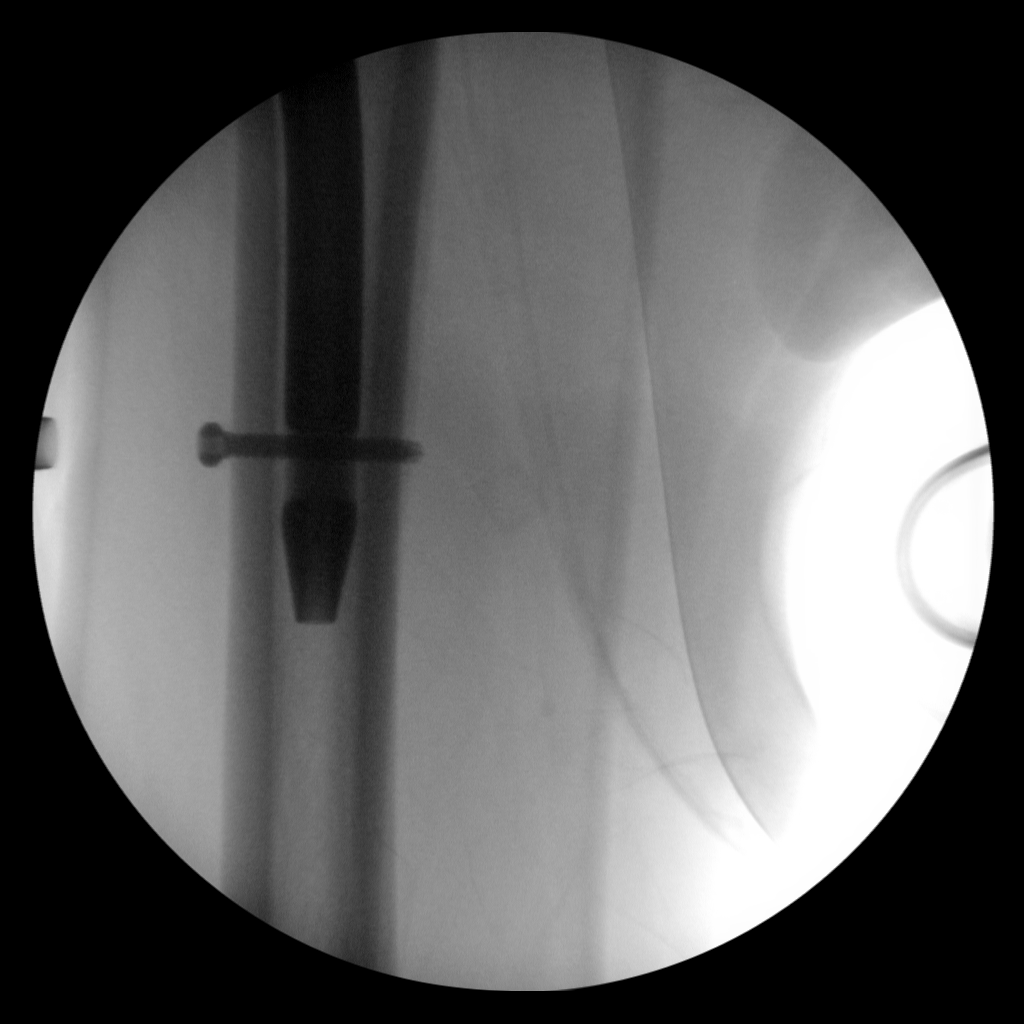
[im 2/4]
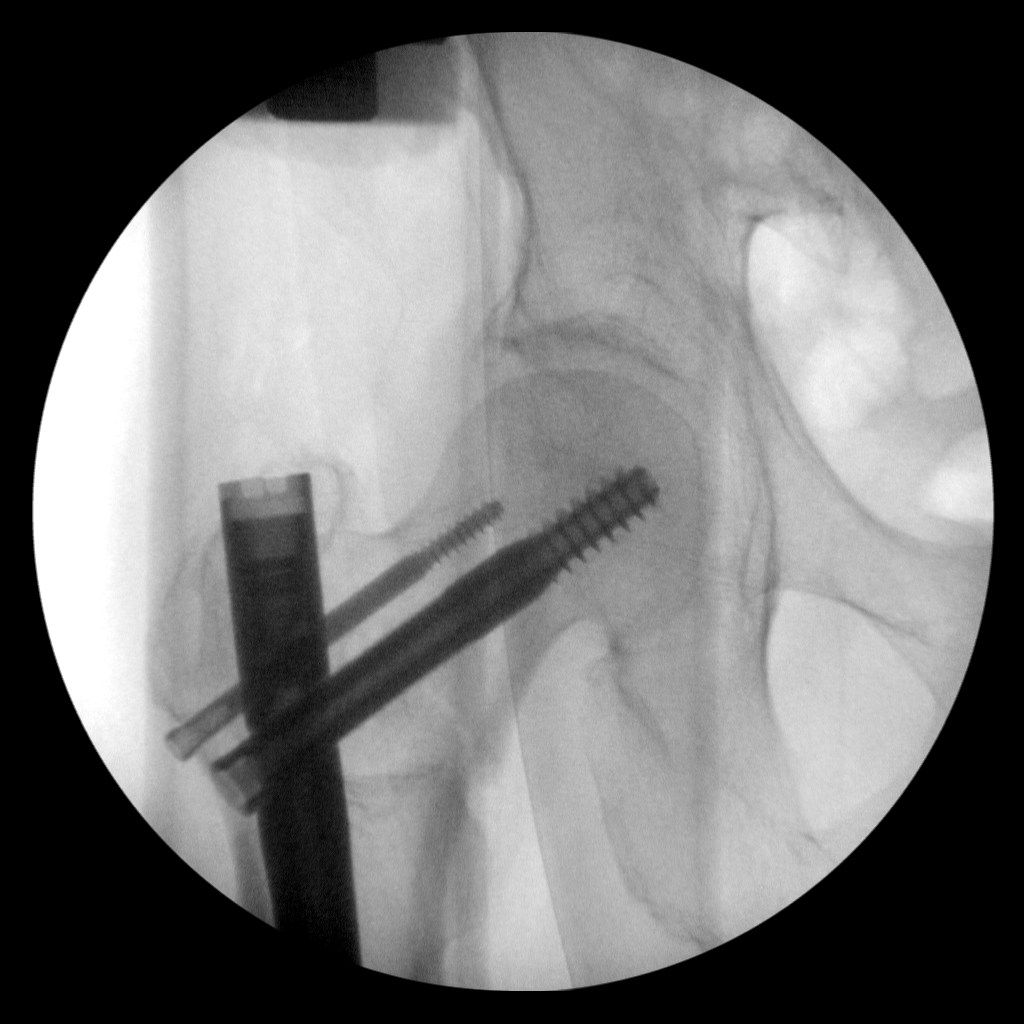
[im 3/4]
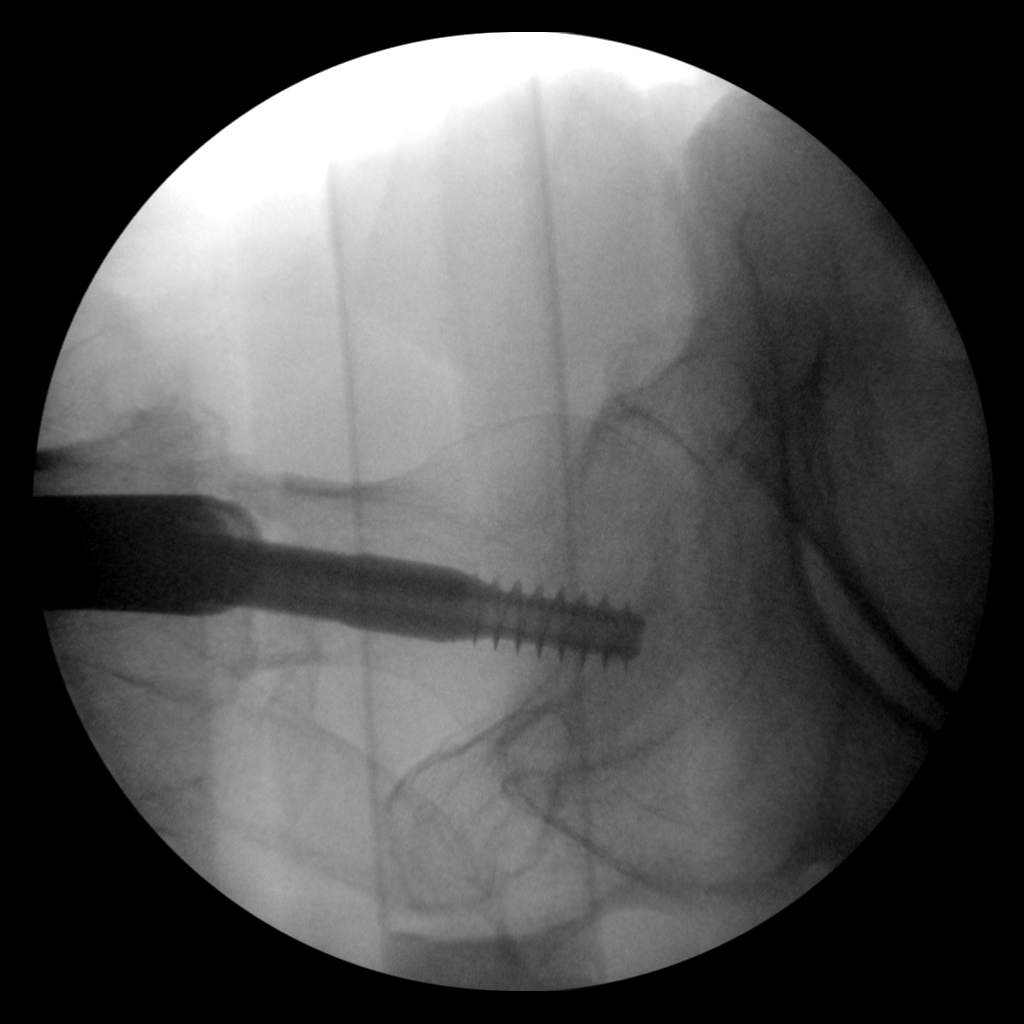
[im 4/4]
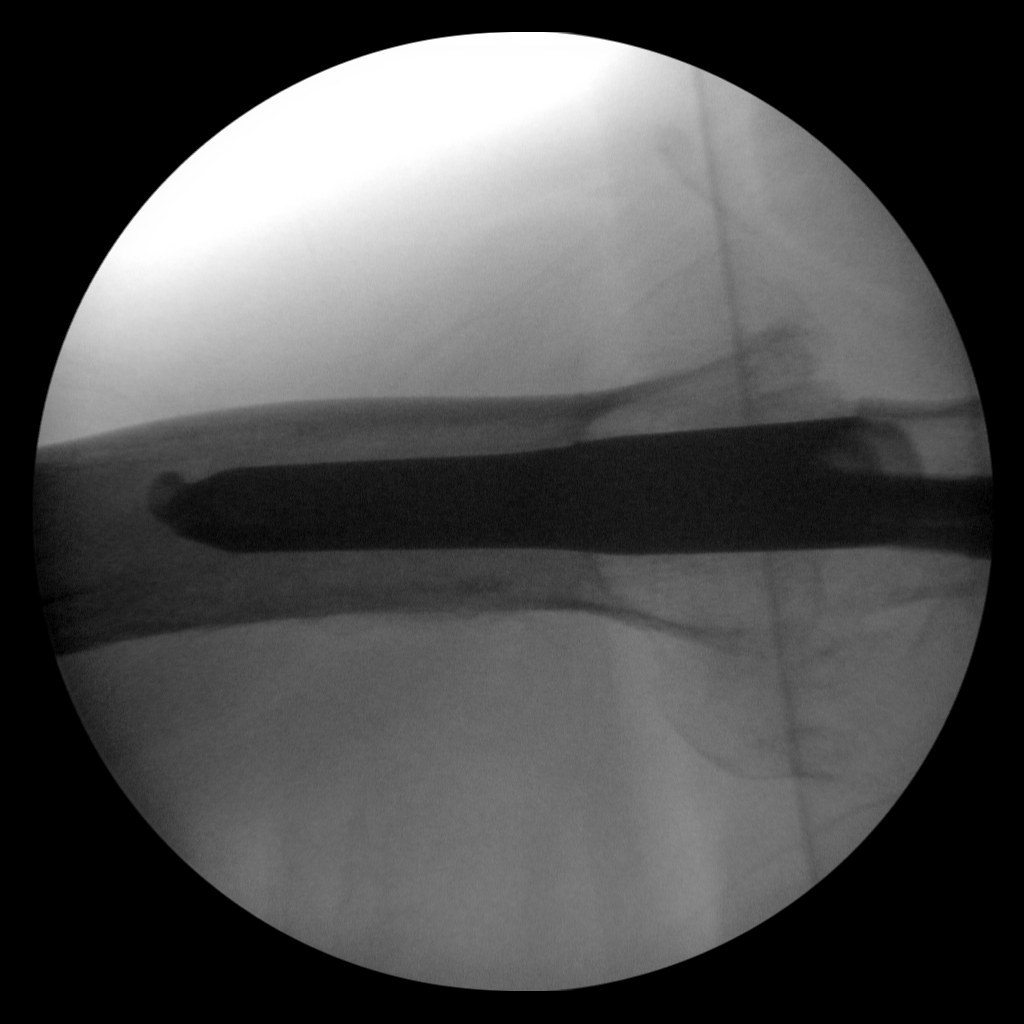

[4 of 4 positions shown; findings below may reference images not displayed]

FINDINGS: The patient is status post placement of a dynamic hip screw across
the proximal right femur, transfixing the previously noted
comminuted fracture in grossly anatomic alignment. No new fractures
are seen. The right femoral head remains seated at the acetabulum.
IMPRESSION: Status post internal fixation of comminuted proximal right femoral
fracture in grossly anatomic alignment.
